# Patient Record
Sex: Female | Born: 1965 | Race: White | Hispanic: No | Marital: Married | Smoking: Never smoker
Health system: Southern US, Community
[De-identification: ages and names within clinical notes are randomized; demographics above are authoritative.]

## PROBLEM LIST (undated history)

## (undated) DIAGNOSIS — F32A Depression, unspecified: Secondary | ICD-10-CM

## (undated) DIAGNOSIS — F329 Major depressive disorder, single episode, unspecified: Secondary | ICD-10-CM

## (undated) DIAGNOSIS — E785 Hyperlipidemia, unspecified: Secondary | ICD-10-CM

## (undated) HISTORY — DX: Hyperlipidemia, unspecified: E78.5

## (undated) HISTORY — DX: Depression, unspecified: F32.A

---
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
## 2019-02-13 ENCOUNTER — Other Ambulatory Visit: Payer: Self-pay

## 2019-02-13 ENCOUNTER — Telehealth: Payer: Self-pay

## 2019-02-13 NOTE — Telephone Encounter (Signed)
Pt. Requesting to establish with a PCP at [facility]. Warm transfer to [place] in the practice.

## 2019-02-17 ENCOUNTER — Encounter: Payer: Self-pay | Admitting: Family Medicine

## 2019-02-17 ENCOUNTER — Other Ambulatory Visit: Payer: Self-pay

## 2019-02-17 ENCOUNTER — Ambulatory Visit (INDEPENDENT_AMBULATORY_CARE_PROVIDER_SITE_OTHER): Admitting: Family Medicine

## 2019-02-17 VITALS — BP 128/82 | HR 74 | Temp 98.0°F | Ht 67.0 in | Wt 198.0 lb

## 2019-02-17 DIAGNOSIS — F418 Other specified anxiety disorders: Secondary | ICD-10-CM | POA: Diagnosis not present

## 2019-02-17 DIAGNOSIS — E785 Hyperlipidemia, unspecified: Secondary | ICD-10-CM | POA: Diagnosis not present

## 2019-02-17 MED ORDER — PRAVASTATIN SODIUM 20 MG PO TABS: 20.0000 mg | ORAL_TABLET | Freq: Every day | ORAL | 3 refills | Status: DC

## 2019-02-17 MED ORDER — HYDROXYZINE HCL 25 MG PO TABS: 25.0000 mg | ORAL_TABLET | Freq: Three times a day (TID) | ORAL | 1 refills | Status: DC | PRN

## 2019-02-17 MED ORDER — VENLAFAXINE HCL ER 37.5 MG PO CP24: 37.5000 mg | ORAL_CAPSULE | Freq: Every day | ORAL | 2 refills | Status: DC

## 2019-02-17 NOTE — Patient Instructions (Signed)
Please consider counseling. Contact [phone] to schedule an appointment or inquire about cost/insurance coverage.  Aim to do some physical exertion for 150 minutes per week. This is typically divided into 5 days per week, 30 minutes per day. The activity should be enough to get your heart rate up. Anything is better than nothing if you have time constraints.  Keep the diet clean.   Coping skills Choose 5 that work for you:  Take a deep breath  Count to 20  Read a book  Do a puzzle  Meditate  Bake  Sing  Knit  Garden  Pray  Go outside  Call a friend  Listen to music  Take a walk  Color  Send a note  Take a bath  Watch a movie  Be alone in a quiet place  Pet an animal  Visit a friend  Journal  Exercise  Stretch   Let us know if you need anything.

## 2019-02-17 NOTE — Progress Notes (Signed)
Chief Complaint  Patient presents with  . New Patient (Initial Visit)       New Patient Visit SUBJECTIVE: HPI: Jenny Roberson is an 53 y.o.female who is being seen for establishing care.  The patient was previously seen in [place].  Hyperlipidemia Patient presents for dyslipidemia follow up. Currently being treated with pravastatin 20 mg daily and compliance with treatment thus far has been good. She denies myalgias. She is adhering to a healthy diet. Exercise: some walking The patient is not known to have coexisting coronary artery disease.  The past 2 months since moving from [place] to [place] amounts of depression, anxiety, and riding in the car are going over/under bridges, over passes, or the expressway causes a panic.  She has been trying to set up with counseling, unsuccessfully.  She has a family history of depression/anxiety.  She used to be on Zoloft and Prozac.  It helped, tolerated well, though they were not especially efficacious.  Denies any thoughts of harming herself or others.  She is not self-medicating.  Allergies  Allergen Reactions  . Amoxicillin Other (See Comments)    Palms of hands and bottom of feet turned red/skin peeled  . Codeine Nausea And Vomiting   Past Medical History:  Diagnosis Date  . Depression   . Hyperlipidemia    Past Surgical History:  Procedure Laterality Date  . ABDOMINAL HYSTERECTOMY  2013  . OTHER SURGICAL HISTORY  2014   lipoma removal     Family History  Problem Relation Age of Onset  . Cancer Mother   . Heart disease Mother    Allergies  Allergen Reactions  . Amoxicillin Other (See Comments)    Palms of hands and bottom of feet turned red/skin peeled  . Codeine Nausea And Vomiting    Current Outpatient Medications:  .  pravastatin (PRAVACHOL) 20 MG tablet, Take 1 tablet (20 mg total) by mouth daily., Disp: 90 tablet, Rfl: 3 .  hydrOXYzine (ATARAX/VISTARIL) 25 MG tablet, Take 1-3 tablets (25-75 mg total) by mouth 3 (three)  times daily as needed for anxiety., Disp: 90 tablet, Rfl: 1 .  venlafaxine XR (EFFEXOR-XR) 37.5 MG 24 hr capsule, Take 1 capsule (37.5 mg total) by mouth daily with breakfast., Disp: 30 capsule, Rfl: 2  ROS Cardiovascular: Denies chest pain  Respiratory: Denies dyspnea   OBJECTIVE: BP 128/82 (BP Location: Left Arm, Patient Position: Sitting, Cuff Size: Large)   Pulse 74   Temp 98 F (36.7 C) (Oral)   Ht 5\' 7"  (1.702 m)   Wt 198 lb (89.8 kg)   SpO2 98%   BMI 31.01 kg/m   Constitutional: -  VS reviewed -  Well developed, well nourished, appears stated age -  No apparent distress  Psychiatric: -  Oriented to person, place, and time -  Memory intact -  Very tearful during exam -  Fluent conversation, good eye contact -  Judgment and insight age appropriate  Eye: -  Conjunctivae clear, no discharge -  Pupils symmetric, round, reactive to light  ENMT: -  MMM    Pharynx moist, no exudate, no erythema  Neck: -  No gross swelling, no palpable masses -  Thyroid midline, not enlarged, mobile, no palpable masses  Cardiovascular: -  RRR -  No bruits -  No LE edema  Respiratory: -  Normal respiratory effort, no accessory muscle use, no retraction -  Breath sounds equal, no wheezes, no ronchi, no crackles  Neurological:  -  CN II - XII grossly  intact -  DTR's equal and symmetric -  Sensation grossly intact to light touch, equal bilaterally  Skin: -  No significant lesion on inspection -  Warm and dry to palpation   ASSESSMENT/PLAN: Anxiety with depression - Plan: venlafaxine XR (EFFEXOR-XR) 37.5 MG 24 hr capsule, hydrOXYzine (ATARAX/VISTARIL) 25 MG tablet, will see if we can help set up w counseling.  Hyperlipidemia, unspecified hyperlipidemia type - Plan: pravastatin (PRAVACHOL) 20 MG tablet, counseled on diet and exercise.   Patient instructed to sign release of records form from her previous PCP. Patient should return in 6 weeks. The patient voiced understanding and agreement  to the plan.   Jilda Rocheicholas Paul [place], DO 02/17/19  12:10 PM

## 2019-03-08 ENCOUNTER — Ambulatory Visit (INDEPENDENT_AMBULATORY_CARE_PROVIDER_SITE_OTHER): Admitting: Psychology

## 2019-03-08 DIAGNOSIS — F4323 Adjustment disorder with mixed anxiety and depressed mood: Secondary | ICD-10-CM

## 2019-03-23 ENCOUNTER — Ambulatory Visit (INDEPENDENT_AMBULATORY_CARE_PROVIDER_SITE_OTHER): Admitting: Psychology

## 2019-03-23 DIAGNOSIS — F4323 Adjustment disorder with mixed anxiety and depressed mood: Secondary | ICD-10-CM | POA: Diagnosis not present

## 2019-03-24 ENCOUNTER — Other Ambulatory Visit (HOSPITAL_COMMUNITY)
Admission: RE | Admit: 2019-03-24 | Discharge: 2019-03-24 | Disposition: A | Discharge status: Home / Self Care | Source: Ambulatory Visit | Attending: Family Medicine | Admitting: Family Medicine

## 2019-03-24 ENCOUNTER — Encounter: Payer: Self-pay | Admitting: Family Medicine

## 2019-03-24 ENCOUNTER — Other Ambulatory Visit: Payer: Self-pay

## 2019-03-24 ENCOUNTER — Ambulatory Visit (INDEPENDENT_AMBULATORY_CARE_PROVIDER_SITE_OTHER): Admitting: Family Medicine

## 2019-03-24 VITALS — BP 108/76 | HR 81 | Temp 96.5°F | Ht 67.0 in | Wt 199.0 lb

## 2019-03-24 DIAGNOSIS — N760 Acute vaginitis: Secondary | ICD-10-CM | POA: Insufficient documentation

## 2019-03-24 DIAGNOSIS — B9689 Other specified bacterial agents as the cause of diseases classified elsewhere: Secondary | ICD-10-CM | POA: Diagnosis present

## 2019-03-24 DIAGNOSIS — F418 Other specified anxiety disorders: Secondary | ICD-10-CM

## 2019-03-24 LAB — POCT URINALYSIS DIPSTICK
Bilirubin, UA: NEGATIVE
Blood, UA: NEGATIVE
Glucose, UA: NEGATIVE
Ketones, UA: NEGATIVE
Leukocytes, UA: NEGATIVE
Nitrite, UA: NEGATIVE
Protein, UA: POSITIVE — AB
Spec Grav, UA: 1.025 (ref 1.010–1.025)
Urobilinogen, UA: NEGATIVE E.U./dL — AB
pH, UA: 6 (ref 5.0–8.0)

## 2019-03-24 MED ORDER — VENLAFAXINE HCL ER 75 MG PO CP24: 75.0000 mg | ORAL_CAPSULE | Freq: Every day | ORAL | 2 refills | Status: DC

## 2019-03-24 MED ORDER — METRONIDAZOLE 500 MG PO TABS: 500.0000 mg | ORAL_TABLET | Freq: Two times a day (BID) | ORAL | 0 refills | Status: AC

## 2019-03-24 NOTE — Progress Notes (Signed)
Chief Complaint  Patient presents with  . Vaginal Discharge    odor    Jenny Roberson is a 53 y.o. female here for vaginal discharge.  Duration: 2 weeks Description of discharge: white Odor: Yes; fishy New sexual partner: Yes Urinary complaints: No Denies fevers, bleeding, pregnancy abdominal pain.  Fu for mood also. Reports doing around 30% better on 37.5 mg/d Effexor. No AE's. Has taken hydroxyzine 2-3 times, worked well when she did take it. She is following w a Social worker.   ROS:  GU: +discharge, denies pain with urination Psych: Improved mood  Past Medical History:  Diagnosis Date  . Depression   . Hyperlipidemia    Family History  Problem Relation Age of Onset  . Cancer Mother   . Heart disease Mother     BP 108/76 (BP Location: Left Arm, Patient Position: Sitting, Cuff Size: Normal)   Pulse 81   Temp (!) 96.5 F (35.8 C) (Temporal)   Ht 5\' 7"  (1.702 m)   Wt 199 lb (90.3 kg)   SpO2 97%   BMI 31.17 kg/m  Gen: Awake, alert, appears stated age Heart: RRR, no murmurs Lungs: CTAB, no accessory muscle use Abd: BS+, soft, NT, ND, no masses or organomegaly GU: Deferred Psych: Age appropriate judgment and insight, nml mood and affect; did become tearful during the exam.   Bacterial vaginosis - Plan: metroNIDAZOLE (FLAGYL) 500 MG tablet, POCT Urinalysis Dipstick  Anxiety with depression - Plan: venlafaxine XR (EFFEXOR XR) 75 MG 24 hr capsule; increase from 37.5 mg/d to 75 mg/d. Cont hydroxyzine. Cont w counseling.   Orders as above. F/u in 1 mo to reck mood. Pt voiced understanding and agreement to the plan.  [place], DO 03/24/19 3:41 PM

## 2019-03-24 NOTE — Patient Instructions (Addendum)
Do not drink alcohol on this medication.  Take 2 caps of your Effexor until you run out, but a new dose has been called in.  Let us know if you need anything.

## 2019-03-24 NOTE — Addendum Note (Signed)
Addended by: Sharon Seller B on: 03/24/2019 03:53 PM   Modules accepted: Orders

## 2019-03-27 LAB — URINE CYTOLOGY ANCILLARY ONLY

## 2019-03-31 ENCOUNTER — Encounter (HOSPITAL_BASED_OUTPATIENT_CLINIC_OR_DEPARTMENT_OTHER)

## 2019-03-31 ENCOUNTER — Ambulatory Visit: Admitting: Family Medicine

## 2019-04-04 ENCOUNTER — Other Ambulatory Visit (HOSPITAL_BASED_OUTPATIENT_CLINIC_OR_DEPARTMENT_OTHER): Payer: Self-pay | Admitting: Family Medicine

## 2019-04-04 ENCOUNTER — Ambulatory Visit (HOSPITAL_BASED_OUTPATIENT_CLINIC_OR_DEPARTMENT_OTHER)
Admission: RE | Admit: 2019-04-04 | Discharge: 2019-04-04 | Disposition: A | Discharge status: Home / Self Care | Source: Ambulatory Visit

## 2019-04-04 ENCOUNTER — Other Ambulatory Visit: Payer: Self-pay

## 2019-04-04 DIAGNOSIS — Z1231 Encounter for screening mammogram for malignant neoplasm of breast: Secondary | ICD-10-CM

## 2019-04-05 ENCOUNTER — Ambulatory Visit (INDEPENDENT_AMBULATORY_CARE_PROVIDER_SITE_OTHER): Admitting: Psychology

## 2019-04-05 DIAGNOSIS — F4323 Adjustment disorder with mixed anxiety and depressed mood: Secondary | ICD-10-CM

## 2019-04-19 ENCOUNTER — Ambulatory Visit (INDEPENDENT_AMBULATORY_CARE_PROVIDER_SITE_OTHER): Admitting: Psychology

## 2019-04-19 DIAGNOSIS — F4323 Adjustment disorder with mixed anxiety and depressed mood: Secondary | ICD-10-CM

## 2019-04-24 ENCOUNTER — Encounter: Payer: Self-pay | Admitting: Family Medicine

## 2019-04-24 ENCOUNTER — Ambulatory Visit (INDEPENDENT_AMBULATORY_CARE_PROVIDER_SITE_OTHER): Admitting: Family Medicine

## 2019-04-24 ENCOUNTER — Other Ambulatory Visit: Payer: Self-pay

## 2019-04-24 VITALS — BP 118/72 | HR 106 | Temp 98.3°F | Ht 67.0 in | Wt 199.2 lb

## 2019-04-24 DIAGNOSIS — Z23 Encounter for immunization: Secondary | ICD-10-CM

## 2019-04-24 DIAGNOSIS — F418 Other specified anxiety disorders: Secondary | ICD-10-CM | POA: Diagnosis not present

## 2019-04-24 NOTE — Patient Instructions (Signed)
Find out for sure when your last tetanus shot was.  Let us know when you need refills.  Let us know if you need anything.

## 2019-04-24 NOTE — Progress Notes (Signed)
Chief Complaint  Patient presents with  . Follow-up    Subjective Jenny Roberson presents for f/u anxiety/depression.  She is currently being treated with Effexor 75 mg/d XR.  Reports good improvement since treatment. No thoughts of harming self or others. No self-medication with alcohol, prescription drugs or illicit drugs. Pt is following with a counselor/psychologist.  ROS Psych: No homicidal or suicidal thoughts  Past Medical History:  Diagnosis Date  . Depression   . Hyperlipidemia    Exam BP 118/72 (BP Location: Left Arm, Patient Position: Sitting, Cuff Size: Normal)   Pulse (!) 106   Temp 98.3 F (36.8 C) (Temporal)   Ht 5\' 7"  (1.702 m)   Wt 199 lb 4 oz (90.4 kg)   SpO2 97%   BMI 31.21 kg/m  General:  well developed, well nourished, in no apparent distress Lungs:  clear to auscultation, breath sounds equal bilaterally, no respiratory distress Cardio:  regular rate and rhythm without murmurs, heart sounds without clicks or rubs Psych: well oriented with normal range of affect and age-appropriate judgement/insight, alert and oriented x4.  Assessment and Plan  Anxiety with depression  Need for influenza vaccination - Plan: Flu Vaccine QUAD 6+ mos PF IM (Fluarix Quad PF)  Cont Effexor. She looks happy. Does not wish to change.  F/u in 6 mo for CPE or pr. The patient voiced understanding and agreement to the plan.  [place], DO 04/24/19 1:39 PM

## 2019-04-25 ENCOUNTER — Encounter (HOSPITAL_BASED_OUTPATIENT_CLINIC_OR_DEPARTMENT_OTHER): Payer: Self-pay | Admitting: Radiology

## 2019-04-25 ENCOUNTER — Ambulatory Visit (HOSPITAL_BASED_OUTPATIENT_CLINIC_OR_DEPARTMENT_OTHER)
Admission: RE | Admit: 2019-04-25 | Discharge: 2019-04-25 | Disposition: A | Discharge status: Home / Self Care | Source: Ambulatory Visit | Attending: Family Medicine | Admitting: Family Medicine

## 2019-04-25 DIAGNOSIS — Z1231 Encounter for screening mammogram for malignant neoplasm of breast: Secondary | ICD-10-CM | POA: Insufficient documentation

## 2019-05-09 ENCOUNTER — Other Ambulatory Visit: Payer: Self-pay | Admitting: Family Medicine

## 2019-05-09 DIAGNOSIS — F418 Other specified anxiety disorders: Secondary | ICD-10-CM

## 2019-05-09 MED ORDER — VENLAFAXINE HCL ER 75 MG PO CP24: 75.0000 mg | ORAL_CAPSULE | Freq: Every day | ORAL | 2 refills | Status: DC

## 2019-05-10 ENCOUNTER — Ambulatory Visit (INDEPENDENT_AMBULATORY_CARE_PROVIDER_SITE_OTHER): Admitting: Psychology

## 2019-05-10 DIAGNOSIS — F4323 Adjustment disorder with mixed anxiety and depressed mood: Secondary | ICD-10-CM | POA: Diagnosis not present

## 2019-05-31 ENCOUNTER — Ambulatory Visit: Admitting: Psychology

## 2019-08-04 ENCOUNTER — Other Ambulatory Visit: Payer: Self-pay | Admitting: Family Medicine

## 2019-08-04 DIAGNOSIS — F418 Other specified anxiety disorders: Secondary | ICD-10-CM

## 2019-08-04 MED ORDER — VENLAFAXINE HCL ER 75 MG PO CP24: 75.0000 mg | ORAL_CAPSULE | Freq: Every day | ORAL | 2 refills | Status: DC

## 2019-09-12 ENCOUNTER — Ambulatory Visit (HOSPITAL_BASED_OUTPATIENT_CLINIC_OR_DEPARTMENT_OTHER)
Admission: RE | Admit: 2019-09-12 | Discharge: 2019-09-12 | Disposition: A | Discharge status: Home / Self Care | Source: Ambulatory Visit | Attending: Medical | Admitting: Medical

## 2019-09-12 ENCOUNTER — Other Ambulatory Visit: Payer: Self-pay

## 2019-09-12 ENCOUNTER — Ambulatory Visit (INDEPENDENT_AMBULATORY_CARE_PROVIDER_SITE_OTHER): Admitting: Medical

## 2019-09-12 VITALS — BP 104/60 | HR 98 | Temp 97.4°F | Resp 12 | Ht 67.0 in | Wt 202.4 lb

## 2019-09-12 DIAGNOSIS — M25572 Pain in left ankle and joints of left foot: Secondary | ICD-10-CM | POA: Diagnosis not present

## 2019-09-12 NOTE — Patient Instructions (Addendum)
You had probable  mild strain of medial ankle connective tissue. Getting better now. Would recommend ace wrap for additional 3-4 days, low dose ibuprofen and rest.  After discussion placed xray to ankle can get done today.  Follow up as needed

## 2019-09-12 NOTE — Progress Notes (Signed)
Subjective:    Patient ID: Jenny Roberson, female    DOB: 11-20-1965, 54 y.o.   MRN: [id number]  HPI  Pt in with left ankle and swelling for about one week. She states last week she was walking and noted some pain. Pt states [facility] but then [facility].   Last week had the pain. Today pain is minimal now with residual swelling. No recent exercise or trauma.   Pt tried some ace wrap for 3 days and some advil.    Review of Systems  Constitutional: Negative for chills, fatigue and fever.  Respiratory: Negative for chest tightness, shortness of breath and wheezing.   Cardiovascular: Negative for chest pain and palpitations.  Gastrointestinal: Negative for abdominal pain, blood in stool, diarrhea, rectal pain and vomiting.  Musculoskeletal: Negative for back pain.  Skin: Negative for rash.  Hematological: Negative for adenopathy. Does not bruise/bleed easily.  Psychiatric/Behavioral: Negative for behavioral problems and confusion.   Past Medical History:  Diagnosis Date  . Depression   . Hyperlipidemia      Social History   Socioeconomic History  . Marital status: Married    Spouse name: Not on file  . Number of children: Not on file  . Years of education: Not on file  . Highest education level: Not on file  Occupational History  . Not on file  Tobacco Use  . Smoking status: Never Smoker  . Smokeless tobacco: Never Used  Substance and Sexual Activity  . Alcohol use: Not on file    Comment: occasional  . Drug use: Never  . Sexual activity: Not on file  Other Topics Concern  . Not on file  Social History Narrative  . Not on file   Social Determinants of Health   Financial Resource Strain:   . Difficulty of Paying Living Expenses: Not on file  Food Insecurity:   . Worried About Charity fundraiser in the Last Year: Not on file  . Ran Out of Food in the Last Year: Not on file  Transportation Needs:   . Lack of Transportation (Medical): Not  on file  . Lack of Transportation (Non-Medical): Not on file  Physical Activity:   . Days of Exercise per Week: Not on file  . Minutes of Exercise per Session: Not on file  Stress:   . Feeling of Stress : Not on file  Social Connections:   . Frequency of Communication with Friends and Family: Not on file  . Frequency of Social Gatherings with Friends and Family: Not on file  . Attends Religious Services: Not on file  . Active Member of Clubs or Organizations: Not on file  . Attends Archivist Meetings: Not on file  . Marital Status: Not on file  Intimate Partner Violence:   . Fear of Current or Ex-Partner: Not on file  . Emotionally Abused: Not on file  . Physically Abused: Not on file  . Sexually Abused: Not on file    Past Surgical History:  Procedure Laterality Date  . ABDOMINAL HYSTERECTOMY  2013  . OTHER SURGICAL HISTORY  2014   lipoma removal    Family History  Problem Relation Age of Onset  . Cancer Mother   . Heart disease Mother     Allergies  Allergen Reactions  . Amoxicillin Other (See Comments)    Palms of hands and bottom of feet turned red/skin peeled  . Codeine Nausea And Vomiting    Current  Outpatient Medications on File Prior to Visit  Medication Sig Dispense Refill  . hydrOXYzine (ATARAX/VISTARIL) 25 MG tablet Take 1-3 tablets (25-75 mg total) by mouth 3 (three) times daily as needed for anxiety. 90 tablet 1  . pravastatin (PRAVACHOL) 20 MG tablet Take 1 tablet (20 mg total) by mouth daily. 90 tablet 3  . venlafaxine XR (EFFEXOR XR) 75 MG 24 hr capsule Take 1 capsule (75 mg total) by mouth daily with breakfast. 30 capsule 2   No current facility-administered medications on file prior to visit.    BP 104/60 (BP Location: Left Arm, Cuff Size: Large)   Pulse 98   Temp (!) 97.4 F (36.3 C) (Temporal)   Resp 12   Ht 5\' 7"  (1.702 m)   Wt 202 lb 6.4 oz (91.8 kg)   SpO2 100%   BMI 31.70 kg/m       Objective:   Physical  Exam  General- No acute distress. Pleasant patient. Lungs- Clear, even and unlabored. Heart- regular rate and rhythm. Neurologic- CNII- XII grossly intact.  Left ankle- faint swelling to medial aspect. No warmth. Faint very minimal tender to palpation.       Assessment & Plan:  You probable had mild strain of medial ankle connective tissue. Getting better now. Would recommend ace wrap for additional 3-4 days, low dose ibuprofen and rest.  After discussion placed xray to ankle can get done today.  Follow up as needed  20 minutes spent with pt today.

## 2019-09-13 ENCOUNTER — Telehealth: Payer: Self-pay | Admitting: Family Medicine

## 2019-09-13 NOTE — Telephone Encounter (Signed)
Pt is returning a call from Xray results. I told her she would get a cal on Friday due to [place] bing closed on Friday

## 2019-09-14 NOTE — Telephone Encounter (Signed)
No acute abnormality of ankle on xray. Not sure why xray report did not come to me. But found under image?? Notify pt.

## 2019-09-15 NOTE — Telephone Encounter (Signed)
Patient given results

## 2019-10-06 ENCOUNTER — Other Ambulatory Visit: Payer: Self-pay

## 2019-10-09 ENCOUNTER — Other Ambulatory Visit: Payer: Self-pay

## 2019-10-09 ENCOUNTER — Ambulatory Visit (INDEPENDENT_AMBULATORY_CARE_PROVIDER_SITE_OTHER): Admitting: Family Medicine

## 2019-10-09 ENCOUNTER — Encounter: Payer: Self-pay | Admitting: Family Medicine

## 2019-10-09 VITALS — BP 108/62 | HR 97 | Temp 95.8°F | Ht 67.0 in | Wt 201.2 lb

## 2019-10-09 DIAGNOSIS — Z0000 Encounter for general adult medical examination without abnormal findings: Secondary | ICD-10-CM | POA: Diagnosis not present

## 2019-10-09 DIAGNOSIS — Z114 Encounter for screening for human immunodeficiency virus [HIV]: Secondary | ICD-10-CM | POA: Diagnosis not present

## 2019-10-09 LAB — CBC
HCT: 29.4 % — ABNORMAL LOW (ref 36.0–46.0)
Hemoglobin: 9.8 g/dL — ABNORMAL LOW (ref 12.0–15.0)
MCHC: 33.4 g/dL (ref 30.0–36.0)
MCV: 86.8 fl (ref 78.0–100.0)
Platelets: 229 10*3/uL (ref 150.0–400.0)
RBC: 3.38 Mil/uL — ABNORMAL LOW (ref 3.87–5.11)
RDW: 14 % (ref 11.5–15.5)
WBC: 5.2 10*3/uL (ref 4.0–10.5)

## 2019-10-09 LAB — LIPID PANEL
Cholesterol: 169 mg/dL (ref 0–200)
HDL: 45.7 mg/dL (ref >39.00)
NonHDL: 123.06
Total CHOL/HDL Ratio: 4
Triglycerides: 203 mg/dL — ABNORMAL HIGH (ref 0.0–149.0)
VLDL: 40.6 mg/dL — ABNORMAL HIGH (ref 0.0–40.0)

## 2019-10-09 LAB — COMPREHENSIVE METABOLIC PANEL
ALT: 23 U/L (ref 0–35)
AST: 19 U/L (ref 0–37)
Albumin: 4 g/dL (ref 3.5–5.2)
Alkaline Phosphatase: 124 U/L — ABNORMAL HIGH (ref 39–117)
BUN: 15 mg/dL (ref 6–23)
CO2: 24 mEq/L (ref 19–32)
Calcium: 9.2 mg/dL (ref 8.4–10.5)
Chloride: 103 mEq/L (ref 96–112)
Creatinine, Ser: 0.76 mg/dL (ref 0.40–1.20)
GFR: 79.26 mL/min (ref >60.00)
Glucose, Bld: 147 mg/dL — ABNORMAL HIGH (ref 70–99)
Potassium: 3.5 mEq/L (ref 3.5–5.1)
Sodium: 137 mEq/L (ref 135–145)
Total Bilirubin: 0.3 mg/dL (ref 0.2–1.2)
Total Protein: 7.1 g/dL (ref 6.0–8.3)

## 2019-10-09 LAB — LDL CHOLESTEROL, DIRECT: Direct LDL: 91 mg/dL

## 2019-10-09 NOTE — Progress Notes (Signed)
Chief Complaint  Patient presents with  . Annual Exam     Well Woman Jenny Roberson is here for a complete physical.   Her last physical was >1 year ago.  Current diet: in general, a "healthy" diet. Current exercise: . Weight is stable and she denies daytime fatigue. No LMP recorded. Patient has had a hysterectomy. Seatbelt? Yes  Health Maintenance Mammogram-  Yes Colon cancer screening-Yes Shingrix- No Tetanus- Yes HIV screening- No  Past Medical History:  Diagnosis Date  . Depression   . Hyperlipidemia      Past Surgical History:  Procedure Laterality Date  . ABDOMINAL HYSTERECTOMY  2013  . OTHER SURGICAL HISTORY  2014   lipoma removal    Medications  Current Outpatient Medications on File Prior to Visit  Medication Sig Dispense Refill  . hydrOXYzine (ATARAX/VISTARIL) 25 MG tablet Take 1-3 tablets (25-75 mg total) by mouth 3 (three) times daily as needed for anxiety. 90 tablet 1  . pravastatin (PRAVACHOL) 20 MG tablet Take 1 tablet (20 mg total) by mouth daily. 90 tablet 3  . venlafaxine XR (EFFEXOR XR) 75 MG 24 hr capsule Take 1 capsule (75 mg total) by mouth daily with breakfast. 30 capsule 2    Allergies Allergies  Allergen Reactions  . Amoxicillin Other (See Comments)    Palms of hands and bottom of feet turned red/skin peeled  . Codeine Nausea And Vomiting    Review of Systems: Constitutional:  no unexpected weight changes Eye:  no recent significant change in vision Ear/Nose/Mouth/Throat:  Ears:  no recent change in hearing Nose/Mouth/Throat:  no complaints of nasal congestion, no sore throat Cardiovascular: no chest pain Respiratory:  no shortness of breath Gastrointestinal:  no abdominal pain, no change in bowel habits GU:  Female: negative for dysuria or pelvic pain Musculoskeletal/Extremities:  no pain of the joints Integumentary (Skin/Breast):  no abnormal skin lesions reported Neurologic:  no headaches Endocrine:  denies  fatigue Hematologic/Lymphatic:  No areas of easy bleeding  Exam BP 108/62 (BP Location: Left Arm, Patient Position: Sitting, Cuff Size: Normal)   Pulse 97   Temp (!) 95.8 F (35.4 C) (Temporal)   Ht 5\' 7"  (1.702 m)   Wt 201 lb 4 oz (91.3 kg)   SpO2 98%   BMI 31.52 kg/m  General:  well developed, well nourished, in no apparent distress Skin:  no significant moles, warts, or growths Head:  no masses, lesions, or tenderness Eyes:  pupils equal and round, sclera anicteric without injection Ears:  canals without lesions, TMs shiny without retraction, no obvious effusion, no erythema Nose:  nares patent, septum midline, mucosa normal, and no drainage or sinus tenderness Throat/Pharynx:  lips and gingiva without lesion; tongue and uvula midline; non-inflamed pharynx; no exudates or postnasal drainage Neck: neck supple without adenopathy, thyromegaly, or masses Lungs:  clear to auscultation, breath sounds equal bilaterally, no respiratory distress Cardio:  regular rate and rhythm, no LE edema Abdomen:  abdomen soft, nontender; bowel sounds normal; no masses or organomegaly Genital: Defer to GYN Musculoskeletal:  symmetrical muscle groups noted without atrophy or deformity Extremities:  no clubbing, cyanosis, or edema, no deformities, no skin discoloration Neuro:  gait normal; deep tendon reflexes normal and symmetric Psych: well oriented with normal range of affect and appropriate judgment/insight  Assessment and Plan  Well adult exam - Plan: CBC, Comprehensive metabolic panel, Lipid panel  Screening for HIV (human immunodeficiency virus) - Plan: HIV Antibody (routine testing w rflx)   Well 54 y.o. female.  Counseled on diet and exercise. Other orders as above. GYN info. I think she is going through menopause based on s/s's. Soy, black cohosh suggested. Declined med changes. She is on SNRI.  Follow up in 6 mo. The patient voiced understanding and agreement to the plan.  [place], DO 10/09/19 9:39 AM

## 2019-10-09 NOTE — Patient Instructions (Addendum)
Give [place] 2-3 business days to get the results of your labs back.   Keep the diet clean and stay active.  The new Shingrix vaccine (for shingles) is a 2 shot series. It can make people feel low energy, achy and almost like they have the flu for 48 hours after injection. Please plan accordingly when deciding on when to get this shot. Call our office for a nurse visit appointment to get this. The second shot of the series is less severe regarding the side effects, but it still lasts 48 hours. I would wait at least 2 weeks after your second covid vaccination.   Call [facility] at [facility] at [phone] for an appointment.  They are located at [address], [address], [place], [place], [address] (right across the hall from our office).  Consider black cohosh or soy to help with menopausal symptoms.   Let us know if you need anything.

## 2019-10-10 ENCOUNTER — Other Ambulatory Visit: Payer: Self-pay | Admitting: Family Medicine

## 2019-10-10 ENCOUNTER — Other Ambulatory Visit (INDEPENDENT_AMBULATORY_CARE_PROVIDER_SITE_OTHER)

## 2019-10-10 ENCOUNTER — Encounter: Payer: Self-pay | Admitting: Family Medicine

## 2019-10-10 DIAGNOSIS — R739 Hyperglycemia, unspecified: Secondary | ICD-10-CM

## 2019-10-10 DIAGNOSIS — D729 Disorder of white blood cells, unspecified: Secondary | ICD-10-CM

## 2019-10-10 DIAGNOSIS — D72818 Other decreased white blood cell count: Secondary | ICD-10-CM

## 2019-10-10 DIAGNOSIS — R748 Abnormal levels of other serum enzymes: Secondary | ICD-10-CM

## 2019-10-10 DIAGNOSIS — E781 Pure hyperglyceridemia: Secondary | ICD-10-CM

## 2019-10-10 LAB — IBC + FERRITIN
Ferritin: 4.9 ng/mL — ABNORMAL LOW (ref 10.0–291.0)
Iron: 42 ug/dL (ref 42–145)
Saturation Ratios: 9.1 % — ABNORMAL LOW (ref 20.0–50.0)
Transferrin: 331 mg/dL (ref 212.0–360.0)

## 2019-10-10 LAB — HIV ANTIBODY (ROUTINE TESTING W REFLEX): HIV 1&2 Ab, 4th Generation: NONREACTIVE

## 2019-10-10 LAB — HEMOGLOBIN A1C: Hgb A1c MFr Bld: 5.3 % (ref 4.6–6.5)

## 2019-10-20 ENCOUNTER — Other Ambulatory Visit: Payer: Self-pay

## 2019-10-23 ENCOUNTER — Other Ambulatory Visit (INDEPENDENT_AMBULATORY_CARE_PROVIDER_SITE_OTHER)

## 2019-10-23 ENCOUNTER — Other Ambulatory Visit: Payer: Self-pay

## 2019-10-23 DIAGNOSIS — E781 Pure hyperglyceridemia: Secondary | ICD-10-CM | POA: Diagnosis not present

## 2019-10-23 DIAGNOSIS — R748 Abnormal levels of other serum enzymes: Secondary | ICD-10-CM | POA: Diagnosis not present

## 2019-10-23 LAB — LIPID PANEL
Cholesterol: 149 mg/dL (ref 0–200)
HDL: 47.6 mg/dL (ref >39.00)
LDL Cholesterol: 68 mg/dL (ref 0–99)
NonHDL: 101.87
Total CHOL/HDL Ratio: 3
Triglycerides: 170 mg/dL — ABNORMAL HIGH (ref 0.0–149.0)
VLDL: 34 mg/dL (ref 0.0–40.0)

## 2019-10-23 LAB — GAMMA GT: GGT: 33 U/L (ref 7–51)

## 2019-10-23 LAB — HEPATIC FUNCTION PANEL
ALT: 18 U/L (ref 0–35)
AST: 16 U/L (ref 0–37)
Albumin: 4.2 g/dL (ref 3.5–5.2)
Alkaline Phosphatase: 117 U/L (ref 39–117)
Bilirubin, Direct: 0.1 mg/dL (ref 0.0–0.3)
Total Bilirubin: 0.4 mg/dL (ref 0.2–1.2)
Total Protein: 6.8 g/dL (ref 6.0–8.3)

## 2019-10-24 ENCOUNTER — Encounter: Admitting: Family Medicine

## 2019-10-26 LAB — ALKALINE PHOSPHATASE ISOENZYMES
Alkaline phosphatase (APISO): 117 U/L (ref 37–153)
Bone Isoenzymes: 50 % (ref 28–66)
Intestinal Isoenzymes: 0 % — ABNORMAL LOW (ref 1–24)
Liver Isoenzymes: 50 % (ref 25–69)

## 2019-11-20 ENCOUNTER — Other Ambulatory Visit: Payer: Self-pay | Admitting: Family Medicine

## 2019-11-20 DIAGNOSIS — F418 Other specified anxiety disorders: Secondary | ICD-10-CM

## 2019-11-20 MED ORDER — VENLAFAXINE HCL ER 75 MG PO CP24: 75.0000 mg | ORAL_CAPSULE | Freq: Every day | ORAL | 2 refills | Status: DC

## 2020-02-22 ENCOUNTER — Other Ambulatory Visit: Payer: Self-pay | Admitting: Family Medicine

## 2020-02-22 DIAGNOSIS — F418 Other specified anxiety disorders: Secondary | ICD-10-CM

## 2020-03-18 ENCOUNTER — Other Ambulatory Visit: Payer: Self-pay | Admitting: Family Medicine

## 2020-03-18 DIAGNOSIS — E785 Hyperlipidemia, unspecified: Secondary | ICD-10-CM

## 2020-04-10 ENCOUNTER — Ambulatory Visit: Admitting: Family Medicine

## 2020-04-18 ENCOUNTER — Other Ambulatory Visit: Payer: Self-pay | Admitting: Family Medicine

## 2020-04-18 DIAGNOSIS — Z1231 Encounter for screening mammogram for malignant neoplasm of breast: Secondary | ICD-10-CM

## 2020-04-23 ENCOUNTER — Ambulatory Visit (INDEPENDENT_AMBULATORY_CARE_PROVIDER_SITE_OTHER): Admitting: Family Medicine

## 2020-04-23 ENCOUNTER — Other Ambulatory Visit: Payer: Self-pay

## 2020-04-23 ENCOUNTER — Encounter: Payer: Self-pay | Admitting: Family Medicine

## 2020-04-23 VITALS — BP 108/68 | HR 75 | Temp 98.2°F | Ht 67.0 in | Wt 202.2 lb

## 2020-04-23 DIAGNOSIS — F418 Other specified anxiety disorders: Secondary | ICD-10-CM | POA: Diagnosis not present

## 2020-04-23 DIAGNOSIS — Z23 Encounter for immunization: Secondary | ICD-10-CM | POA: Diagnosis not present

## 2020-04-23 DIAGNOSIS — R790 Abnormal level of blood mineral: Secondary | ICD-10-CM | POA: Diagnosis not present

## 2020-04-23 DIAGNOSIS — E785 Hyperlipidemia, unspecified: Secondary | ICD-10-CM

## 2020-04-23 MED ORDER — VENLAFAXINE HCL ER 75 MG PO CP24: ORAL_CAPSULE | ORAL | 2 refills | Status: DC

## 2020-04-23 MED ORDER — HYDROXYZINE HCL 25 MG PO TABS: 25.0000 mg | ORAL_TABLET | Freq: Three times a day (TID) | ORAL | 1 refills | Status: DC | PRN

## 2020-04-23 NOTE — Progress Notes (Signed)
Chief Complaint  Patient presents with  . Follow-up    check lab work    Subjective Jenny Roberson presents for f/u anxiety/depression.   Pt is currently being treated with Effexor XR 75 mg/d.  Reports doing well since treatment. No thoughts of harming self or others. No self-medication with alcohol, prescription drugs or illicit drugs. Pt is not following with a counselor/psychologist.  Hyperlipidemia Patient presents for dyslipidemia follow up. Currently being treated with pravastatin 20 mg/d and compliance with treatment thus far has been good. She denies myalgias. She is adhering to a healthy diet. Exercise: cycling The patient is not known to have coexisting coronary artery disease. No CP or SOB.   Past Medical History:  Diagnosis Date  . Depression   . Hyperlipidemia    Allergies as of 04/23/2020      Reactions   Amoxicillin Other (See Comments)   Palms of hands and bottom of feet turned red/skin peeled   Codeine Nausea And Vomiting      Medication List       Accurate as of April 23, 2020  8:12 AM. If you have any questions, ask your nurse or doctor.        hydrOXYzine 25 MG tablet Commonly known as: ATARAX/VISTARIL Take 1-3 tablets (25-75 mg total) by mouth 3 (three) times daily as needed for anxiety.   pravastatin 20 MG tablet Commonly known as: PRAVACHOL TAKE 1 TABLET DAILY   venlafaxine XR 75 MG 24 hr capsule Commonly known as: EFFEXOR-XR TAKE 1 CAPSULE(75 MG) BY MOUTH DAILY WITH BREAKFAST       Exam BP 108/68 (BP Location: Left Arm, Patient Position: Sitting, Cuff Size: Normal)   Pulse 75   Temp 98.2 F (36.8 C) (Oral)   Ht 5\' 7"  (1.702 m)   Wt 202 lb 4 oz (91.7 kg)   SpO2 96%   BMI 31.68 kg/m  General:  well developed, well nourished, in no apparent distress Lungs:  No respiratory distress Psych: well oriented with normal range of affect and age-appropriate judgement/insight, alert and oriented x4.  Assessment and Plan  Anxiety  with depression - Plan: venlafaxine XR (EFFEXOR-XR) 75 MG 24 hr capsule, hydrOXYzine (ATARAX/VISTARIL) 25 MG tablet  Hyperlipidemia, unspecified hyperlipidemia type - Plan: Comprehensive metabolic panel, Lipid panel  Low ferritin - Plan: Iron, Ferritin  Need for influenza vaccination - Plan: Flu Vaccine QUAD 6+ mos PF IM (Fluarix Quad PF)   1. Cont Effexor and prn Hydroxyzine.  2. Ck labs. Cont statin. F/u in 6 mo for CPE. The patient voiced understanding and agreement to the plan.  [place], DO 04/23/20 8:12 AM

## 2020-04-23 NOTE — Patient Instructions (Signed)
Give us 2-3 business days to get the results of your labs back.   Keep the diet clean and stay active.  Let us know if you need anything. 

## 2020-04-24 LAB — COMPREHENSIVE METABOLIC PANEL
AG Ratio: 1.5 (calc) (ref 1.0–2.5)
ALT: 12 U/L (ref 6–29)
AST: 12 U/L (ref 10–35)
Albumin: 4.4 g/dL (ref 3.6–5.1)
Alkaline phosphatase (APISO): 110 U/L (ref 37–153)
BUN: 21 mg/dL (ref 7–25)
CO2: 27 mmol/L (ref 20–32)
Calcium: 9.8 mg/dL (ref 8.6–10.4)
Chloride: 105 mmol/L (ref 98–110)
Creat: 0.74 mg/dL (ref 0.50–1.05)
Globulin: 2.9 g/dL (calc) (ref 1.9–3.7)
Glucose, Bld: 98 mg/dL (ref 65–99)
Potassium: 4.4 mmol/L (ref 3.5–5.3)
Sodium: 140 mmol/L (ref 135–146)
Total Bilirubin: 0.4 mg/dL (ref 0.2–1.2)
Total Protein: 7.3 g/dL (ref 6.1–8.1)

## 2020-04-24 LAB — LIPID PANEL
Cholesterol: 163 mg/dL (ref <200)
HDL: 64 mg/dL (ref >=50)
LDL Cholesterol (Calc): 79 mg/dL (calc)
Non-HDL Cholesterol (Calc): 99 mg/dL (calc) (ref <130)
Total CHOL/HDL Ratio: 2.5 (calc) (ref <5.0)
Triglycerides: 118 mg/dL (ref <150)

## 2020-04-24 LAB — FERRITIN: Ferritin: 24 ng/mL (ref 16–232)

## 2020-04-24 LAB — IRON: Iron: 100 ug/dL (ref 45–160)

## 2020-04-25 ENCOUNTER — Ambulatory Visit (INDEPENDENT_AMBULATORY_CARE_PROVIDER_SITE_OTHER)

## 2020-04-25 ENCOUNTER — Other Ambulatory Visit: Payer: Self-pay

## 2020-04-25 DIAGNOSIS — Z1231 Encounter for screening mammogram for malignant neoplasm of breast: Secondary | ICD-10-CM

## 2020-05-06 ENCOUNTER — Encounter: Payer: Self-pay | Admitting: Obstetrics & Gynecology

## 2020-05-06 ENCOUNTER — Other Ambulatory Visit: Payer: Self-pay

## 2020-05-06 ENCOUNTER — Ambulatory Visit (INDEPENDENT_AMBULATORY_CARE_PROVIDER_SITE_OTHER): Admitting: Obstetrics & Gynecology

## 2020-05-06 VITALS — BP 89/57 | HR 90 | Ht 67.0 in | Wt 202.0 lb

## 2020-05-06 DIAGNOSIS — N951 Menopausal and female climacteric states: Secondary | ICD-10-CM | POA: Diagnosis not present

## 2020-05-06 DIAGNOSIS — R635 Abnormal weight gain: Secondary | ICD-10-CM

## 2020-05-06 DIAGNOSIS — Z01419 Encounter for gynecological examination (general) (routine) without abnormal findings: Secondary | ICD-10-CM

## 2020-05-06 MED ORDER — GABAPENTIN 300 MG PO CAPS: 300.0000 mg | ORAL_CAPSULE | Freq: Every day | ORAL | 2 refills | Status: DC

## 2020-05-06 NOTE — Patient Instructions (Signed)
Apps:  MyFitnessPal  FitOn   Calorie Counting for Weight Loss Calories are units of energy. Your body needs a certain amount of calories from food to keep you going throughout the day. When you eat more calories than your body needs, your body stores the extra calories as fat. When you eat fewer calories than your body needs, your body burns fat to get the energy it needs. Calorie counting means keeping track of how many calories you eat and drink each day. Calorie counting can be helpful if you need to lose weight. If you make sure to eat fewer calories than your body needs, you should lose weight. Ask your health care provider what a healthy weight is for you. For calorie counting to work, you will need to eat the right number of calories in a day in order to lose a healthy amount of weight per week. A dietitian can help you determine how many calories you need in a day and will give you suggestions on how to reach your calorie goal.  A healthy amount of weight to lose per week is usually 1-2 lb (0.5-0.9 kg). This usually means that your daily calorie intake should be reduced by 500-750 calories.  Eating 1,200 - 1,500 calories per day can help most women lose weight.  Eating 1,500 - 1,800 calories per day can help most men lose weight. What is my plan? My goal is to have __________ calories per day. If I have this many calories per day, I should lose around __________ pounds per week. What do I need to know about calorie counting? In order to meet your daily calorie goal, you will need to:  Find out how many calories are in each food you would like to eat. Try to do this before you eat.  Decide how much of the food you plan to eat.  Write down what you ate and how many calories it had. Doing this is called keeping a food log. To successfully lose weight, it is important to balance calorie counting with a healthy lifestyle that includes regular activity. Aim for 150 minutes of moderate  exercise (such as walking) or 75 minutes of vigorous exercise (such as running) each week. Where do I find calorie information?  The number of calories in a food can be found on a Nutrition Facts label. If a food does not have a Nutrition Facts label, try to look up the calories online or ask your dietitian for help. Remember that calories are listed per serving. If you choose to have more than one serving of a food, you will have to multiply the calories per serving by the amount of servings you plan to eat. For example, the label on a package of bread might say that a serving size is 1 slice and that there are 90 calories in a serving. If you eat 1 slice, you will have eaten 90 calories. If you eat 2 slices, you will have eaten 180 calories. How do I keep a food log? Immediately after each meal, record the following information in your food log:  What you ate. Don't forget to include toppings, sauces, and other extras on the food.  How much you ate. This can be measured in cups, ounces, or number of items.  How many calories each food and drink had.  The total number of calories in the meal. Keep your food log near you, such as in a small notebook in your pocket, or use a mobile  app or website. Some programs will calculate calories for you and show you how many calories you have left for the day to meet your goal. What are some calorie counting tips?   Use your calories on foods and drinks that will fill you up and not leave you hungry: ? Some examples of foods that fill you up are nuts and nut butters, vegetables, lean proteins, and high-fiber foods like whole grains. High-fiber foods are foods with more than 5 g fiber per serving. ? Drinks such as sodas, specialty coffee drinks, alcohol, and juices have a lot of calories, yet do not fill you up.  Eat nutritious foods and avoid empty calories. Empty calories are calories you get from foods or beverages that do not have many vitamins or  protein, such as candy, sweets, and soda. It is better to have a nutritious high-calorie food (such as an avocado) than a food with few nutrients (such as a bag of chips).  Know how many calories are in the foods you eat most often. This will help you calculate calorie counts faster.  Pay attention to calories in drinks. Low-calorie drinks include water and unsweetened drinks.  Pay attention to nutrition labels for "low fat" or "fat free" foods. These foods sometimes have the same amount of calories or more calories than the full fat versions. They also often have added sugar, starch, or salt, to make up for flavor that was removed with the fat.  Find a way of tracking calories that works for you. Get creative. Try different apps or programs if writing down calories does not work for you. What are some portion control tips?  Know how many calories are in a serving. This will help you know how many servings of a certain food you can have.  Use a measuring cup to measure serving sizes. You could also try weighing out portions on a kitchen scale. With time, you will be able to estimate serving sizes for some foods.  Take some time to put servings of different foods on your favorite plates, bowls, and cups so you know what a serving looks like.  Try not to eat straight from a bag or box. Doing this can lead to overeating. Put the amount you would like to eat in a cup or on a plate to make sure you are eating the right portion.  Use smaller plates, glasses, and bowls to prevent overeating.  Try not to multitask (for example, watch TV or use your computer) while eating. If it is time to eat, sit down at a table and enjoy your food. This will help you to know when you are full. It will also help you to be aware of what you are eating and how much you are eating. What are tips for following this plan? Reading food labels  Check the calorie count compared to the serving size. The serving size may be  smaller than what you are used to eating.  Check the source of the calories. Make sure the food you are eating is high in vitamins and protein and low in saturated and trans fats. Shopping  Read nutrition labels while you shop. This will help you make healthy decisions before you decide to purchase your food.  Make a grocery list and stick to it. Cooking  Try to cook your favorite foods in a healthier way. For example, try baking instead of frying.  Use low-fat dairy products. Meal planning  Use more fruits and vegetables.  Half of your plate should be fruits and vegetables.  Include lean proteins like poultry and fish. How do I count calories when eating out?  Ask for smaller portion sizes.  Consider sharing an entree and sides instead of getting your own entree.  If you get your own entree, eat only half. Ask for a box at the beginning of your meal and put the rest of your entree in it so you are not tempted to eat it.  If calories are listed on the menu, choose the lower calorie options.  Choose dishes that include vegetables, fruits, whole grains, low-fat dairy products, and lean protein.  Choose items that are boiled, broiled, grilled, or steamed. Stay away from items that are buttered, battered, fried, or served with cream sauce. Items labeled "crispy" are usually fried, unless stated otherwise.  Choose water, low-fat milk, unsweetened iced tea, or other drinks without added sugar. If you want an alcoholic beverage, choose a lower calorie option such as a glass of wine or light beer.  Ask for dressings, sauces, and syrups on the side. These are usually high in calories, so you should limit the amount you eat.  If you want a salad, choose a garden salad and ask for grilled meats. Avoid extra toppings like bacon, cheese, or fried items. Ask for the dressing on the side, or ask for olive oil and vinegar or lemon to use as dressing.  Estimate how many servings of a food you are  given. For example, a serving of cooked rice is  cup or about the size of half a baseball. Knowing serving sizes will help you be aware of how much food you are eating at restaurants. The list below tells you how big or small some common portion sizes are based on everyday objects: ? 1 oz--4 stacked dice. ? 3 oz--1 deck of cards. ? 1 tsp--1 die. ? 1 Tbsp-- a ping-pong ball. ? 2 Tbsp--1 ping-pong ball. ?  cup-- baseball. ? 1 cup--1 baseball. Summary  Calorie counting means keeping track of how many calories you eat and drink each day. If you eat fewer calories than your body needs, you should lose weight.  A healthy amount of weight to lose per week is usually 1-2 lb (0.5-0.9 kg). This usually means reducing your daily calorie intake by 500-750 calories.  The number of calories in a food can be found on a Nutrition Facts label. If a food does not have a Nutrition Facts label, try to look up the calories online or ask your dietitian for help.  Use your calories on foods and drinks that will fill you up, and not on foods and drinks that will leave you hungry.  Use smaller plates, glasses, and bowls to prevent overeating. This information is not intended to replace advice given to you by your health care provider. Make sure you discuss any questions you have with your health care provider. Document Revised: 04/01/2018 Document Reviewed: 06/12/2016 Elsevier Patient Education  2020 [employer].  Exercising to Lose Weight Exercise is structured, repetitive physical activity to improve fitness and health. Getting regular exercise is important for everyone. It is especially important if you are overweight. Being overweight increases your risk of heart disease, stroke, diabetes, high blood pressure, and several types of cancer. Reducing your calorie intake and exercising can help you lose weight. Exercise is usually categorized as moderate or vigorous intensity. To lose weight, most people need  to do a certain amount of moderate-intensity or vigorous-intensity  exercise each week. Moderate-intensity exercise  Moderate-intensity exercise is any activity that gets you moving enough to burn at least three times more energy (calories) than if you were sitting. Examples of moderate exercise include:  Walking a mile in 15 minutes.  Doing light yard work.  Biking at an easy pace. Most people should get at least 150 minutes (2 hours and 30 minutes) a week of moderate-intensity exercise to maintain their body weight. Vigorous-intensity exercise Vigorous-intensity exercise is any activity that gets you moving enough to burn at least six times more calories than if you were sitting. When you exercise at this intensity, you should be working hard enough that you are not able to carry on a conversation. Examples of vigorous exercise include:  Running.  Playing a team sport, such as football, basketball, and soccer.  Jumping rope. Most people should get at least 75 minutes (1 hour and 15 minutes) a week of vigorous-intensity exercise to maintain their body weight. How can exercise affect me? When you exercise enough to burn more calories than you eat, you lose weight. Exercise also reduces body fat and builds muscle. The more muscle you have, the more calories you burn. Exercise also:  Improves mood.  Reduces stress and tension.  Improves your overall fitness, flexibility, and endurance.  Increases bone strength. The amount of exercise you need to lose weight depends on:  Your age.  The type of exercise.  Any health conditions you have.  Your overall physical ability. Talk to your health care provider about how much exercise you need and what types of activities are safe for you. What actions can I take to lose weight? Nutrition   Make changes to your diet as told by your health care provider or diet and nutrition specialist (dietitian). This may include: ? Eating fewer  calories. ? Eating more protein. ? Eating less unhealthy fats. ? Eating a diet that includes fresh fruits and vegetables, whole grains, low-fat dairy products, and lean protein. ? Avoiding foods with added fat, salt, and sugar.  Drink plenty of water while you exercise to prevent dehydration or heat stroke. Activity  Choose an activity that you enjoy and set realistic goals. Your health care provider can help you make an exercise plan that works for you.  Exercise at a moderate or vigorous intensity most days of the week. ? The intensity of exercise may vary from person to person. You can tell how intense a workout is for you by paying attention to your breathing and heartbeat. Most people will notice their breathing and heartbeat get faster with more intense exercise.  Do resistance training twice each week, such as: ? Push-ups. ? Sit-ups. ? Lifting weights. ? Using resistance bands.  Getting short amounts of exercise can be just as helpful as long structured periods of exercise. If you have trouble finding time to exercise, try to include exercise in your daily routine. ? Get up, stretch, and walk around every 30 minutes throughout the day. ? Go for a walk during your lunch break. ? Park your car farther away from your destination. ? If you take public transportation, get off one stop early and walk the rest of the way. ? Make phone calls while standing up and walking around. ? Take the stairs instead of elevators or escalators.  Wear comfortable clothes and shoes with good support.  Do not exercise so much that you hurt yourself, feel dizzy, or get very short of breath. Where to find more information  [place] Department of Health and Human Services: ThisPath.fi  Centers for Disease Control and Prevention (CDC): FootballExhibition.com.br Contact a health care provider:  Before starting a new exercise program.  If you have questions or concerns about your weight.  If you have a medical  problem that keeps you from exercising. Get help right away if you have any of the following while exercising:  Injury.  Dizziness.  Difficulty breathing or shortness of breath that does not go away when you stop exercising.  Chest pain.  Rapid heartbeat. Summary  Being overweight increases your risk of heart disease, stroke, diabetes, high blood pressure, and several types of cancer.  Losing weight happens when you burn more calories than you eat.  Reducing the amount of calories you eat in addition to getting regular moderate or vigorous exercise each week helps you lose weight. This information is not intended to replace advice given to you by your health care provider. Make sure you discuss any questions you have with your health care provider. Document Revised: 07/26/2017 Document Reviewed: 07/26/2017 Elsevier Patient Education  2020 [employer].

## 2020-05-06 NOTE — Progress Notes (Signed)
Subjective:     Jenny Roberson is a 54 y.o. female here for a routine exam. G0 Current complaints: Pt is teary on exam She reports that her arms hurt from having her BP obtained twice in the office. She also reports being anxious about begin with a new provider. She is s/p a hyst for fibroids in her 53's. She reports a BSO at that time. She reprots that her hot flushes have become worse overtime.    Pts paternal GM had breat cancer. Her mother had breast cancer in her 52's.      Gynecologic History No LMP recorded. Patient has had a hysterectomy. Contraception: status post hysterectomy Last Pap: pt reports h/o abnormal PAP many years ago. Prior to her hyst.     Last mammogram: UTD. Pt has gotten mammograms since the age of 68.    Obstetric History OB History  Gravida Para Term Preterm AB Living  0 0 0 0 0 0  SAB TAB Ectopic Multiple Live Births  0 0 0 0 0   The following portions of the patient's history were reviewed and updated as appropriate: allergies, current medications, past family history, past medical history, past social history, past surgical history and problem list.  Review of Systems Pertinent items are noted in HPI.    Objective:  BP (!) 89/57   Pulse 90   Ht 5\' 7"  (1.702 m)   Wt 202 lb 0.6 oz (91.6 kg)   BMI 31.64 kg/m  General Appearance:    Alert, cooperative, no distress, appears stated age  Head:    Normocephalic, without obvious abnormality, atraumatic  Eyes:    conjunctiva/corneas clear, EOM's intact, both eyes  Ears:    Normal external ear canals, both ears  Nose:   Nares normal, septum midline, mucosa normal, no drainage    or sinus tenderness  Throat:   Lips, mucosa, and tongue normal; teeth and gums normal  Neck:   Supple, symmetrical, trachea midline, no adenopathy;    thyroid:  no enlargement/tenderness/nodules  Back:     Symmetric, no curvature, ROM normal, no CVA tenderness  Lungs:     respirations unlabored  Chest Wall:    No tenderness or  deformity   Heart:    Regular rate and rhythm  Breast Exam:    No tenderness, masses, or nipple abnormality  Abdomen:     Soft, non-tender, bowel sounds active all four quadrants,    no masses, no organomegaly  Genitalia:    Normal female without lesion, discharge or tenderness     Extremities:   Extremities normal, atraumatic, no cyanosis or edema  Pulses:   2+ and symmetric all extremities  Skin:   Skin color, texture, turgor normal, no rashes or lesions    Assessment:    Healthy female exam.   Hot flushes- I have reviewed with pt the treatment options availale to her. She has a strong FH of breast cancer and would like to avoid EES.  Pt would like to try Babapentin as the initial treatment options.     Plan:     Jenny Roberson was seen today for gynecologic exam.  Diagnoses and all orders for this visit:  Well female exam with routine gynecological exam  Weight gain  Menopausal hot flushes -     gabapentin (NEURONTIN) 300 MG capsule; Take 1 capsule (300 mg total) by mouth at bedtime.  f/u in 6 weeks remotely per pt choice to f/u on improvement of hot flushes and the response  to the Gabapentin.     Jenny Roberson L. Harraway-Smith, M.D., [place]

## 2020-05-22 ENCOUNTER — Other Ambulatory Visit: Payer: Self-pay | Admitting: Family Medicine

## 2020-05-22 DIAGNOSIS — F418 Other specified anxiety disorders: Secondary | ICD-10-CM

## 2020-06-05 ENCOUNTER — Ambulatory Visit: Admitting: Obstetrics & Gynecology

## 2020-08-29 IMAGING — MG MM DIGITAL SCREENING BILAT W/ TOMO W/ CAD
6 of 10 series · 6 of 30 positions shown · non-contrast
Comparison: None.

CLINICAL DATA: Screening.

EXAM:
DIGITAL SCREENING BILATERAL MAMMOGRAM WITH TOMO AND CAD

[L CC synth-2D]
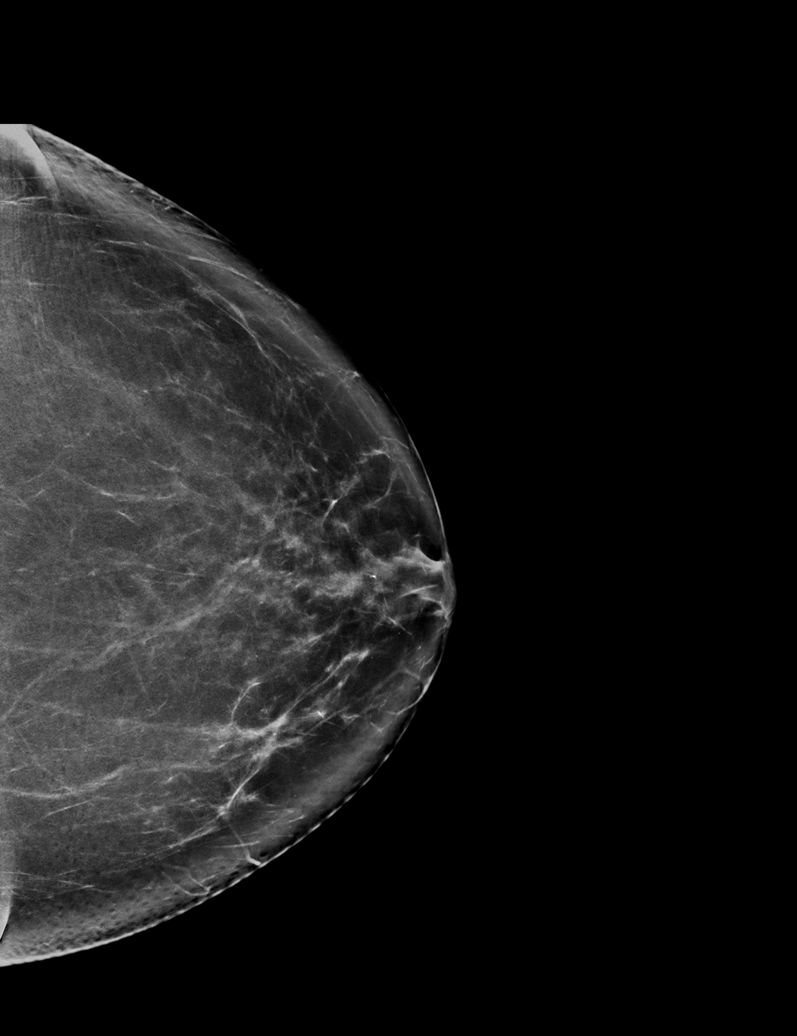

[L MLO synth-2D]
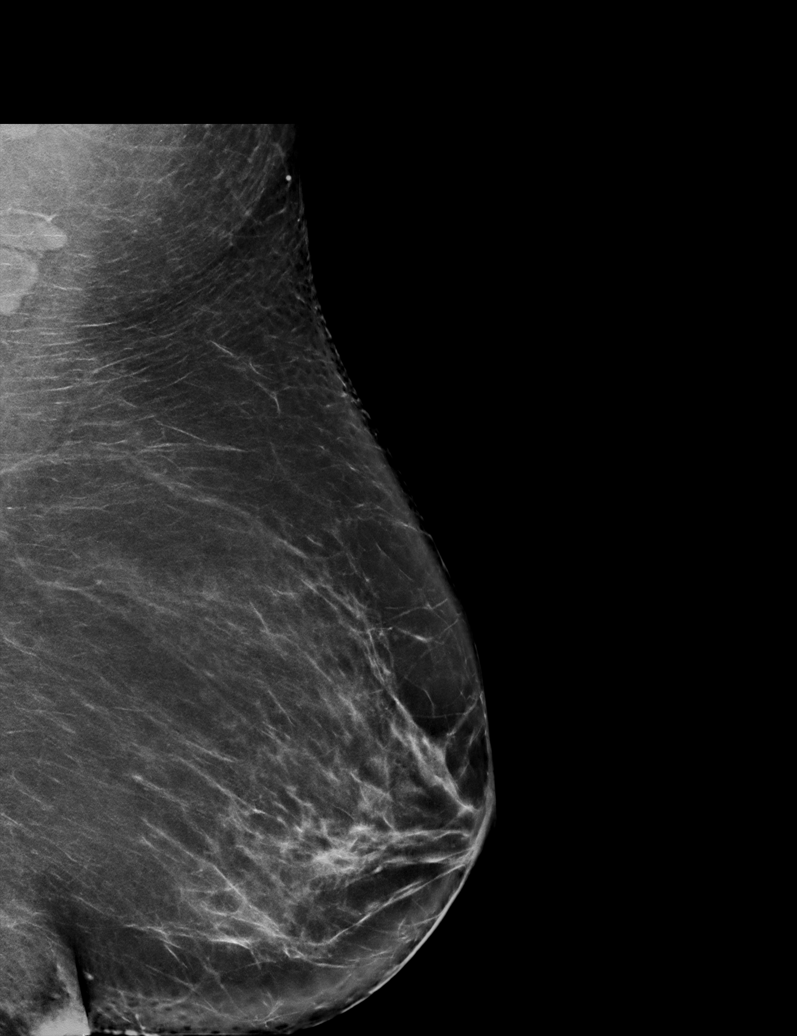

[R MLO synth-2D]
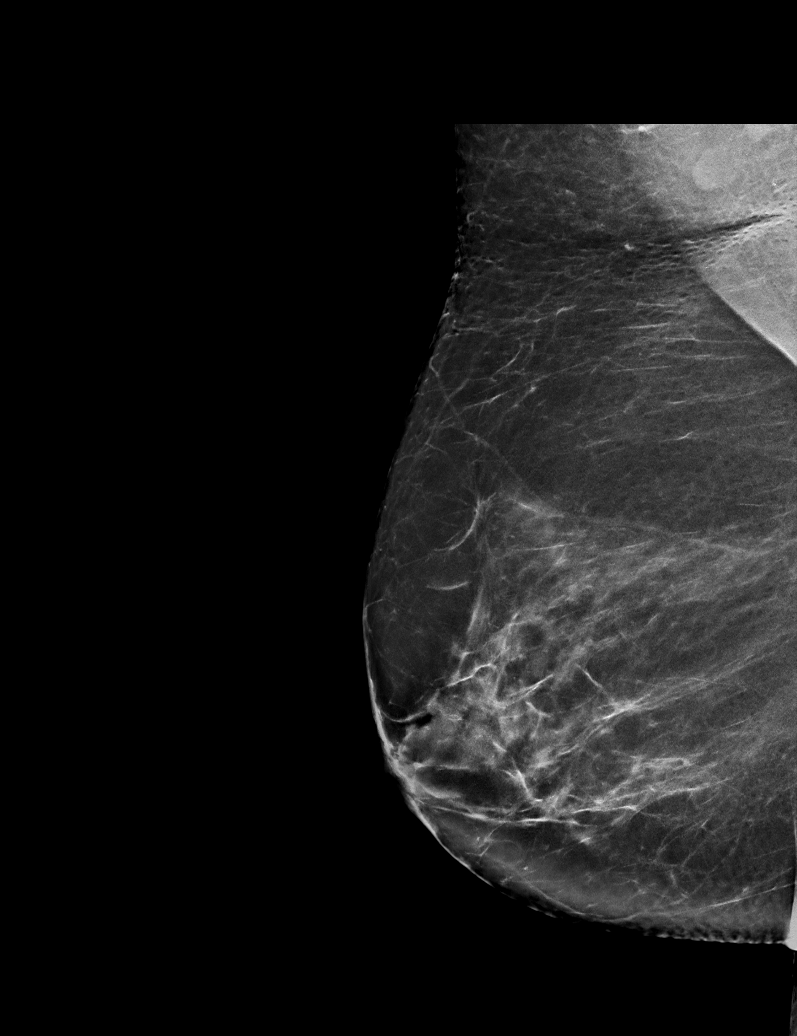

[R CC synth-2D]
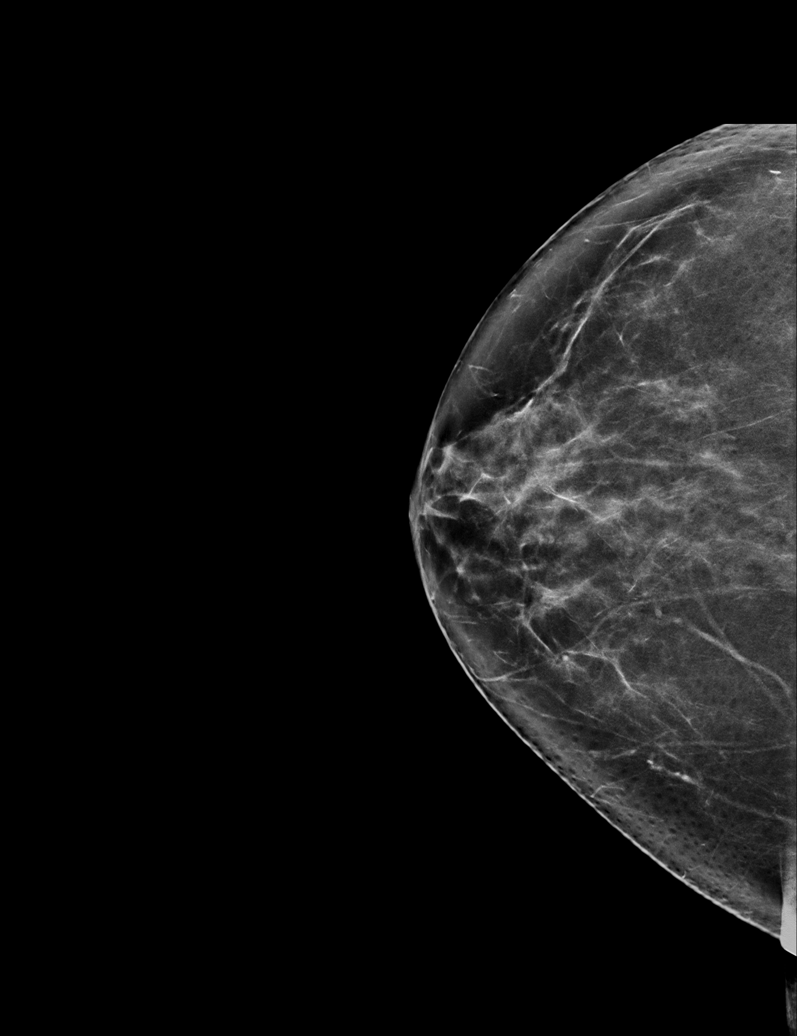

[L XCCL synth-2D]
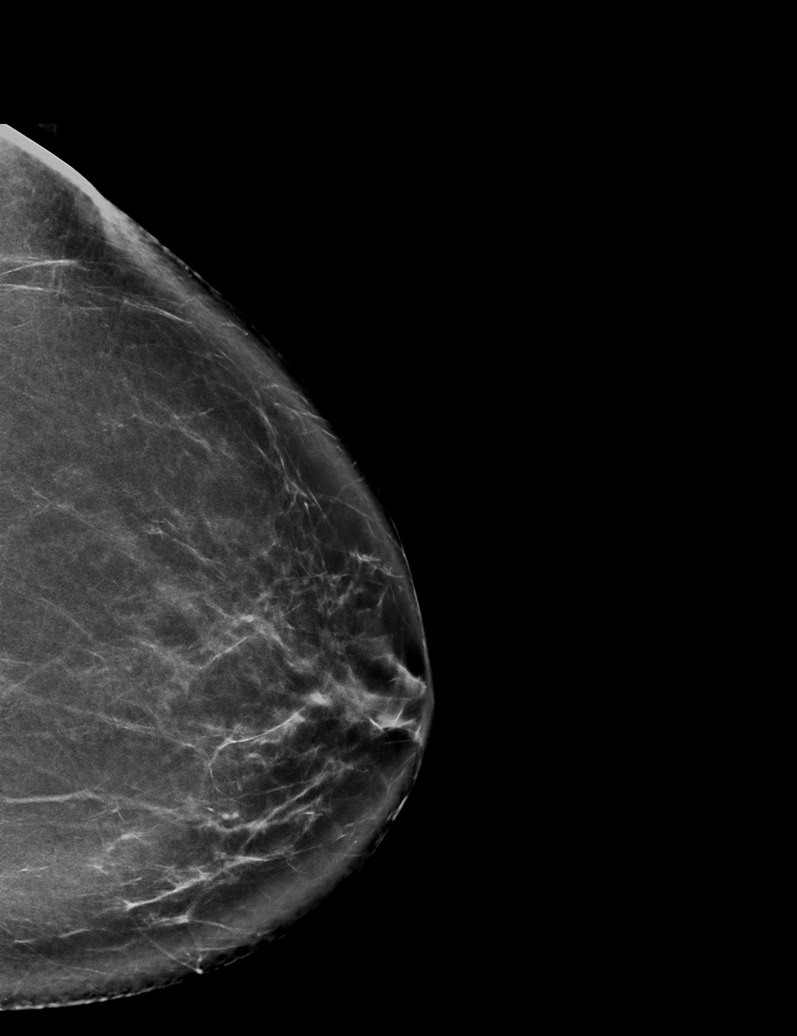

[R CC tomo · tomo slice 37/74.0]
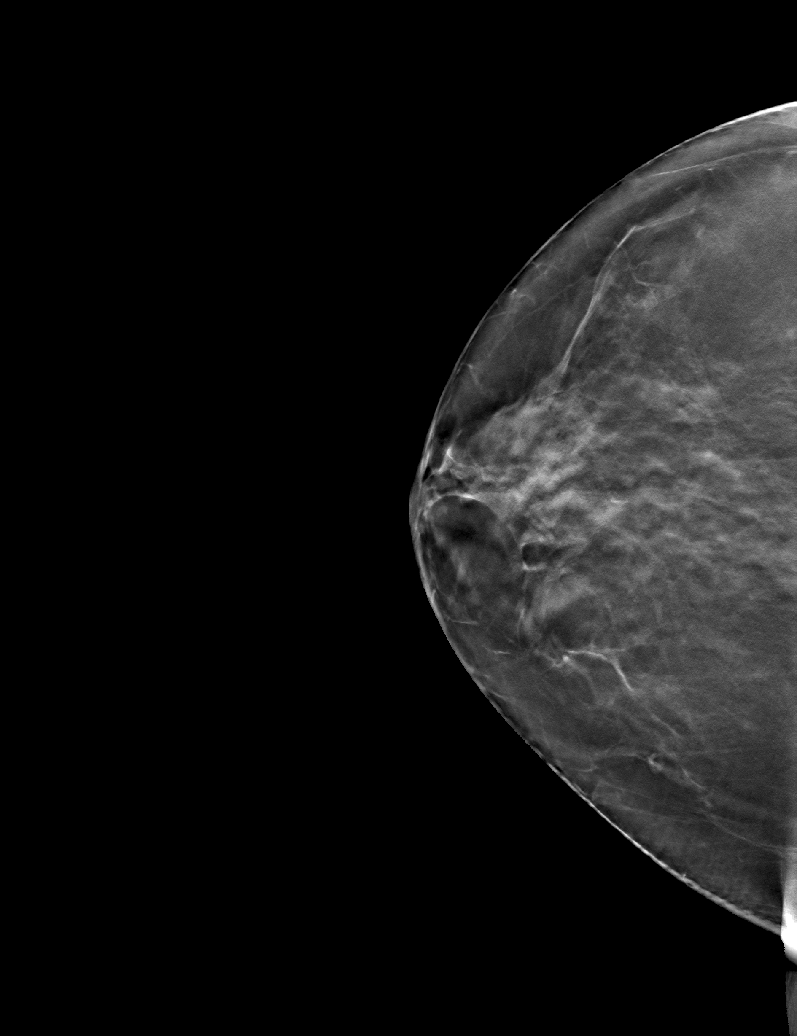

[6 of 30 positions shown; findings below may reference images not displayed]

ACR Breast Density Category b: There are scattered areas of
fibroglandular density.
FINDINGS: There are no findings suspicious for malignancy. Images were
processed with CAD.
IMPRESSION: No mammographic evidence of malignancy. A result letter of this
screening mammogram will be mailed directly to the patient.

RECOMMENDATION:
Screening mammogram in one year. (Code:[id number])

BI-RADS CATEGORY  1: Negative.

## 2020-09-16 ENCOUNTER — Ambulatory Visit: Admitting: Family Medicine

## 2020-10-16 ENCOUNTER — Encounter: Payer: Self-pay | Admitting: Family Medicine

## 2020-10-16 ENCOUNTER — Other Ambulatory Visit: Payer: Self-pay

## 2020-10-16 ENCOUNTER — Ambulatory Visit (INDEPENDENT_AMBULATORY_CARE_PROVIDER_SITE_OTHER): Admitting: Family Medicine

## 2020-10-16 VITALS — BP 110/62 | HR 81 | Temp 97.9°F | Ht 67.0 in | Wt 205.5 lb

## 2020-10-16 DIAGNOSIS — E785 Hyperlipidemia, unspecified: Secondary | ICD-10-CM

## 2020-10-16 DIAGNOSIS — Z1159 Encounter for screening for other viral diseases: Secondary | ICD-10-CM | POA: Diagnosis not present

## 2020-10-16 DIAGNOSIS — Z0000 Encounter for general adult medical examination without abnormal findings: Secondary | ICD-10-CM

## 2020-10-16 LAB — COMPREHENSIVE METABOLIC PANEL
ALT: 15 U/L (ref 0–35)
AST: 14 U/L (ref 0–37)
Albumin: 4.4 g/dL (ref 3.5–5.2)
Alkaline Phosphatase: 112 U/L (ref 39–117)
BUN: 16 mg/dL (ref 6–23)
CO2: 28 mEq/L (ref 19–32)
Calcium: 9.5 mg/dL (ref 8.4–10.5)
Chloride: 103 mEq/L (ref 96–112)
Creatinine, Ser: 0.79 mg/dL (ref 0.40–1.20)
GFR: 84.36 mL/min (ref >60.00)
Glucose, Bld: 103 mg/dL — ABNORMAL HIGH (ref 70–99)
Potassium: 3.9 mEq/L (ref 3.5–5.1)
Sodium: 139 mEq/L (ref 135–145)
Total Bilirubin: 0.4 mg/dL (ref 0.2–1.2)
Total Protein: 7.2 g/dL (ref 6.0–8.3)

## 2020-10-16 LAB — LIPID PANEL
Cholesterol: 167 mg/dL (ref 0–200)
HDL: 56.1 mg/dL (ref >39.00)
LDL Cholesterol: 87 mg/dL (ref 0–99)
NonHDL: 110.89
Total CHOL/HDL Ratio: 3
Triglycerides: 120 mg/dL (ref 0.0–149.0)
VLDL: 24 mg/dL (ref 0.0–40.0)

## 2020-10-16 NOTE — Progress Notes (Signed)
Chief Complaint  Patient presents with  . Annual Exam     Well Woman Jenny Roberson is here for a complete physical.   Her last physical was >1 year ago.  Current diet: in general, a "healthy" diet. Current exercise: walking. Weight is stable and she denies fatigue out of ordinary. Seatbelt? Yes Loss of interested in doing things or depression in past 2 weeks? No  Health Maintenance Pap/HPV- Yes Mammogram- Yes Colon cancer screening-Yes Shingrix- No Tetanus- Yes Hep C screening- No HIV screening- Yes  Past Medical History:  Diagnosis Date  . Depression   . Hyperlipidemia      Past Surgical History:  Procedure Laterality Date  . ABDOMINAL HYSTERECTOMY  2013  . OTHER SURGICAL HISTORY  2014   lipoma removal    Medications  Current Outpatient Medications on File Prior to Visit  Medication Sig Dispense Refill  . hydrOXYzine (ATARAX/VISTARIL) 25 MG tablet Take 1-3 tablets (25-75 mg total) by mouth 3 (three) times daily as needed for anxiety. 90 tablet 1  . pravastatin (PRAVACHOL) 20 MG tablet TAKE 1 TABLET DAILY 90 tablet 3  . venlafaxine XR (EFFEXOR-XR) 75 MG 24 hr capsule TAKE 1 CAPSULE(75 MG) BY MOUTH DAILY WITH BREAKFAST 30 capsule 1  . gabapentin (NEURONTIN) 300 MG capsule Take 1 capsule (300 mg total) by mouth at bedtime. 30 capsule 2   Allergies Allergies  Allergen Reactions  . Amoxicillin Other (See Comments)    Palms of hands and bottom of feet turned red/skin peeled  . Codeine Nausea And Vomiting    Review of Systems: Constitutional:  no unexpected weight changes Eye:  no recent significant change in vision Ear/Nose/Mouth/Throat:  Ears:  no recent change in hearing Nose/Mouth/Throat:  no complaints of nasal congestion, no sore throat Cardiovascular: no chest pain Respiratory:  no shortness of breath Gastrointestinal:  no abdominal pain, no change in bowel habits GU:  Female: negative for dysuria or pelvic pain Musculoskeletal/Extremities:  no pain of the  joints Integumentary (Skin/Breast):  no abnormal skin lesions reported Neurologic:  no headaches Endocrine:  denies fatigue  Exam BP 110/62 (BP Location: Left Arm, Patient Position: Sitting, Cuff Size: Normal)   Pulse 81   Temp 97.9 F (36.6 C) (Oral)   Ht 5\' 7"  (1.702 m)   Wt 205 lb 8 oz (93.2 kg)   SpO2 96%   BMI 32.19 kg/m  General:  well developed, well nourished, in no apparent distress Skin:  no significant moles, warts, or growths Head:  no masses, lesions, or tenderness Eyes:  pupils equal and round, sclera anicteric without injection Ears:  canals without lesions, TMs shiny without retraction, no obvious effusion, no erythema Nose:  nares patent, septum midline, mucosa normal, and no drainage or sinus tenderness Throat/Pharynx:  lips and gingiva without lesion; tongue and uvula midline; non-inflamed pharynx; no exudates or postnasal drainage Neck: neck supple without adenopathy, thyromegaly, or masses Lungs:  clear to auscultation, breath sounds equal bilaterally, no respiratory distress Cardio:  regular rate and rhythm, no LE edema Abdomen:  abdomen soft, nontender; bowel sounds normal; no masses or organomegaly Genital: Defer to GYN Musculoskeletal:  symmetrical muscle groups noted without atrophy or deformity Extremities:  no clubbing, cyanosis, or edema, no deformities, no skin discoloration Neuro:  gait normal; deep tendon reflexes normal and symmetric Psych: well oriented with normal range of affect and appropriate judgment/insight  Assessment and Plan  Well adult exam  Encounter for hepatitis C screening test for low Roberson patient - Plan: Hepatitis  C antibody  Hyperlipidemia, unspecified hyperlipidemia type - Plan: Comprehensive metabolic panel, Lipid panel   Well 55 y.o. female. Counseled on diet and exercise. Wt resistance exercise rec'd.  Other orders as above. Shingrix info given. Vit D and Ca rec'd.  Follow up in 6 mo. The patient voiced understanding  and agreement to the plan.  Jenny Roberson [place], DO 10/16/20 7:54 AM

## 2020-10-16 NOTE — Patient Instructions (Addendum)
Give [place] 2-3 business days to get the results of your labs back.   Keep the diet clean and stay active.  Consider weight resistance exercise to add to your routine.  Start taking 1200 mg of calcium daily and 1000 units of Vit D3 daily.   The new Shingrix vaccine (for shingles) is a 2 shot series. It can make people feel low energy, achy and almost like they have the flu for 48 hours after injection. Please plan accordingly when deciding on when to get this shot. Call our office for a nurse visit appointment to get this. The second shot of the series is less severe regarding the side effects, but it still lasts 48 hours.   Let us know if you need anything.

## 2020-10-17 LAB — HEPATITIS C ANTIBODY
Hepatitis C Ab: NONREACTIVE
SIGNAL TO CUT-OFF: 0.01 (ref <1.00)

## 2021-01-24 ENCOUNTER — Ambulatory Visit (INDEPENDENT_AMBULATORY_CARE_PROVIDER_SITE_OTHER)

## 2021-01-24 ENCOUNTER — Other Ambulatory Visit: Payer: Self-pay

## 2021-01-24 DIAGNOSIS — Z23 Encounter for immunization: Secondary | ICD-10-CM

## 2021-01-24 NOTE — Progress Notes (Signed)
Pt is here today for  shingrix vaccines. Pt was given shingrix vaccine in left deltoid. Pt tolerated well.

## 2021-02-14 ENCOUNTER — Encounter: Payer: Self-pay | Admitting: Family Medicine

## 2021-02-14 ENCOUNTER — Ambulatory Visit (INDEPENDENT_AMBULATORY_CARE_PROVIDER_SITE_OTHER): Admitting: Family Medicine

## 2021-02-14 ENCOUNTER — Other Ambulatory Visit: Payer: Self-pay

## 2021-02-14 VITALS — BP 112/64 | HR 70 | Temp 98.0°F | Ht 67.0 in | Wt 206.5 lb

## 2021-02-14 DIAGNOSIS — H6981 Other specified disorders of Eustachian tube, right ear: Secondary | ICD-10-CM

## 2021-02-14 MED ORDER — FLUTICASONE PROPIONATE 50 MCG/ACT NA SUSP: 2.0000 | Freq: Every day | NASAL | 6 refills | Status: DC

## 2021-02-14 NOTE — Progress Notes (Signed)
Chief Complaint  Patient presents with   Ear Pain    Pt is here for right ear fullness. Duration: 5 days Progression: unchanged Associated symptoms: slight hearing loss Denies: sore throat, congestion, post nasal drip, sneezing, sinus pressure, and low grade fevers, bleeding, or discharge from ear Treatment to date: OTC drops, flushing  Past Medical History:  Diagnosis Date   Depression    Hyperlipidemia     BP 112/64   Pulse 70   Temp 98 F (36.7 C) (Oral)   Ht 5\' 7"  (1.702 m)   Wt 206 lb 8 oz (93.7 kg)   SpO2 99%   BMI 32.34 kg/m  General: Awake, alert, appearing stated age HEENT:  L ear- Canal patent without drainage or erythema, TM is neg R ear- canal patent without drainage or erythema, TM is neg Nose- nares patent and without discharge Mouth- Lips, gums and dentition unremarkable, pharynx is without erythema or exudate Neck: No adenopathy Lungs: Normal effort, no accessory muscle use Psych: Age appropriate judgment and insight, normal mood and affect  Dysfunction of right eustachian tube - Plan: fluticasone (FLONASE) 50 MCG/ACT nasal spray  Instructed how to use Flonase. Give it a couple weeks to work. Offered steroid, understandably declined given no pain.  F/u prn.  Pt voiced understanding and agreement to the plan.  [place], DO 02/14/21 3:51 PM

## 2021-02-14 NOTE — Patient Instructions (Signed)
This should resolve over the next 10-14 days.   Flonase (fluticasone); nasal spray that is over the counter. 2 sprays each nostril, once daily. Aim towards the same side eye when you spray.  This is available OTC.  Let us know if you need anything.

## 2021-04-18 ENCOUNTER — Ambulatory Visit (INDEPENDENT_AMBULATORY_CARE_PROVIDER_SITE_OTHER): Admitting: Family Medicine

## 2021-04-18 ENCOUNTER — Encounter: Payer: Self-pay | Admitting: Family Medicine

## 2021-04-18 ENCOUNTER — Other Ambulatory Visit: Payer: Self-pay

## 2021-04-18 VITALS — BP 120/78 | HR 73 | Temp 98.1°F | Ht 67.0 in | Wt 209.0 lb

## 2021-04-18 DIAGNOSIS — Z23 Encounter for immunization: Secondary | ICD-10-CM | POA: Diagnosis not present

## 2021-04-18 DIAGNOSIS — E785 Hyperlipidemia, unspecified: Secondary | ICD-10-CM

## 2021-04-18 DIAGNOSIS — R790 Abnormal level of blood mineral: Secondary | ICD-10-CM

## 2021-04-18 DIAGNOSIS — F418 Other specified anxiety disorders: Secondary | ICD-10-CM | POA: Diagnosis not present

## 2021-04-18 LAB — IBC + FERRITIN
Ferritin: 6.1 ng/mL — ABNORMAL LOW (ref 10.0–291.0)
Iron: 78 ug/dL (ref 42–145)
Saturation Ratios: 17.1 % — ABNORMAL LOW (ref 20.0–50.0)
TIBC: 456.4 ug/dL — ABNORMAL HIGH (ref 250.0–450.0)
Transferrin: 326 mg/dL (ref 212.0–360.0)

## 2021-04-18 LAB — CBC
HCT: 32.3 % — ABNORMAL LOW (ref 36.0–46.0)
Hemoglobin: 10.8 g/dL — ABNORMAL LOW (ref 12.0–15.0)
MCHC: 33.4 g/dL (ref 30.0–36.0)
MCV: 89.6 fl (ref 78.0–100.0)
Platelets: 216 10*3/uL (ref 150.0–400.0)
RBC: 3.6 Mil/uL — ABNORMAL LOW (ref 3.87–5.11)
RDW: 13.1 % (ref 11.5–15.5)
WBC: 4.8 10*3/uL (ref 4.0–10.5)

## 2021-04-18 LAB — LIPID PANEL
Cholesterol: 152 mg/dL (ref 0–200)
HDL: 54.9 mg/dL (ref >39.00)
LDL Cholesterol: 70 mg/dL (ref 0–99)
NonHDL: 97.55
Total CHOL/HDL Ratio: 3
Triglycerides: 136 mg/dL (ref 0.0–149.0)
VLDL: 27.2 mg/dL (ref 0.0–40.0)

## 2021-04-18 MED ORDER — VENLAFAXINE HCL ER 75 MG PO CP24: 75.0000 mg | ORAL_CAPSULE | Freq: Every day | ORAL | 2 refills | Status: DC

## 2021-04-18 MED ORDER — PRAVASTATIN SODIUM 20 MG PO TABS: 20.0000 mg | ORAL_TABLET | Freq: Every day | ORAL | 3 refills | Status: DC

## 2021-04-18 MED ORDER — HYDROXYZINE HCL 25 MG PO TABS
25.0000 mg | ORAL_TABLET | Freq: Three times a day (TID) | ORAL | 5 refills | Status: DC | PRN
Start: 2021-04-18 — End: 2023-05-04

## 2021-04-18 NOTE — Progress Notes (Signed)
Chief Complaint  Patient presents with   Follow-up    Subjective Jenny Roberson presents for f/u anxiety/depression.  Pt is currently being treated with Effexor XR 75 mg/d, hydroxyzine.  Uses latter 1x/mo and it works well.  Reports continuing to do well since treatment. No thoughts of harming self or others. No self-medication with alcohol, prescription drugs or illicit drugs. Pt is not following with a counselor/psychologist.  Hyperlipidemia Patient presents for dyslipidemia follow up. Currently being treated with pravastatin 20 mg/d and compliance with treatment thus far has been good. She denies myalgias. She is adhering to a healthy diet. Exercise: walking The patient is not known to have coexisting coronary artery disease. No Cp or SOB.   Past Medical History:  Diagnosis Date   Depression    Hyperlipidemia    Allergies as of 04/18/2021       Reactions   Amoxicillin Other (See Comments)   Palms of hands and bottom of feet turned red/skin peeled   Codeine Nausea And Vomiting        Medication List        Accurate as of April 18, 2021  8:21 AM. If you have any questions, ask your nurse or doctor.          fluticasone 50 MCG/ACT nasal spray Commonly known as: FLONASE Place 2 sprays into both nostrils daily.   hydrOXYzine 25 MG tablet Commonly known as: ATARAX/VISTARIL Take 1-3 tablets (25-75 mg total) by mouth 3 (three) times daily as needed for anxiety.   pravastatin 20 MG tablet Commonly known as: PRAVACHOL Take 1 tablet (20 mg total) by mouth daily.   venlafaxine XR 75 MG 24 hr capsule Commonly known as: EFFEXOR-XR Take 1 capsule (75 mg total) by mouth daily with breakfast. What changed: See the new instructions. Changed by: Sharlene Dory, DO        Exam BP 120/78   Pulse 73   Temp 98.1 F (36.7 C) (Oral)   Ht 5\' 7"  (1.702 m)   Wt 209 lb (94.8 kg)   SpO2 98%   BMI 32.73 kg/m  General:  well developed, well nourished, in  no apparent distress Lungs:  No respiratory distress Psych: well oriented with normal range of affect and age-appropriate judgement/insight, alert and oriented x4.  Assessment and Plan  Anxiety with depression - Plan: hydrOXYzine (ATARAX/VISTARIL) 25 MG tablet, venlafaxine XR (EFFEXOR-XR) 75 MG 24 hr capsule  Hyperlipidemia, unspecified hyperlipidemia type - Plan: pravastatin (PRAVACHOL) 20 MG tablet, Lipid panel  Low ferritin - Plan: CBC, IBC + Ferritin  Chronic, stable. Cont Effexor XR 75 mg/d, hydroxyzine prn.  Chronic, stable. Cont pravastatin 20 mg/d. Ck lipids today. Also need to f/u on low ferritin as above.  F/u in 6 mo or prn. The patient voiced understanding and agreement to the plan.  Annetta North, DO 04/18/21 8:21 AM

## 2021-04-18 NOTE — Patient Instructions (Signed)
Give us 2-3 business days to get the results of your labs back.   Keep the diet clean and stay active.  Aim to do some physical exertion for 150 minutes per week. This is typically divided into 5 days per week, 30 minutes per day. The activity should be enough to get your heart rate up. Anything is better than nothing if you have time constraints.  Let us know if you need anything.  

## 2021-04-18 NOTE — Addendum Note (Signed)
Addended by: Scharlene Gloss B on: 04/18/2021 08:38 AM   Modules accepted: Orders

## 2021-04-21 ENCOUNTER — Other Ambulatory Visit: Payer: Self-pay | Admitting: Family Medicine

## 2021-04-21 DIAGNOSIS — K625 Hemorrhage of anus and rectum: Secondary | ICD-10-CM

## 2021-04-21 DIAGNOSIS — R790 Abnormal level of blood mineral: Secondary | ICD-10-CM

## 2021-04-23 ENCOUNTER — Other Ambulatory Visit (INDEPENDENT_AMBULATORY_CARE_PROVIDER_SITE_OTHER)

## 2021-04-23 ENCOUNTER — Other Ambulatory Visit: Payer: Self-pay

## 2021-04-23 DIAGNOSIS — R790 Abnormal level of blood mineral: Secondary | ICD-10-CM

## 2021-04-23 LAB — URINALYSIS, MICROSCOPIC ONLY: RBC / HPF: NONE SEEN (ref 0–?)

## 2021-04-28 ENCOUNTER — Other Ambulatory Visit: Payer: Self-pay | Admitting: Family Medicine

## 2021-04-28 DIAGNOSIS — Z1231 Encounter for screening mammogram for malignant neoplasm of breast: Secondary | ICD-10-CM

## 2021-04-29 NOTE — Addendum Note (Signed)
Addended by: Mervin Kung A on: 04/29/2021 04:08 PM   Modules accepted: Orders

## 2021-04-30 ENCOUNTER — Other Ambulatory Visit: Payer: Self-pay

## 2021-04-30 ENCOUNTER — Telehealth: Payer: Self-pay | Admitting: *Deleted

## 2021-04-30 ENCOUNTER — Ambulatory Visit (INDEPENDENT_AMBULATORY_CARE_PROVIDER_SITE_OTHER)

## 2021-04-30 ENCOUNTER — Other Ambulatory Visit (INDEPENDENT_AMBULATORY_CARE_PROVIDER_SITE_OTHER)

## 2021-04-30 DIAGNOSIS — Z1231 Encounter for screening mammogram for malignant neoplasm of breast: Secondary | ICD-10-CM

## 2021-04-30 DIAGNOSIS — K625 Hemorrhage of anus and rectum: Secondary | ICD-10-CM

## 2021-04-30 DIAGNOSIS — R790 Abnormal level of blood mineral: Secondary | ICD-10-CM

## 2021-04-30 DIAGNOSIS — R195 Other fecal abnormalities: Secondary | ICD-10-CM

## 2021-04-30 LAB — FECAL OCCULT BLOOD, IMMUNOCHEMICAL: Fecal Occult Bld: POSITIVE — AB

## 2021-04-30 NOTE — Telephone Encounter (Signed)
Received call from [facility] at [facility] lab reporting positive IFOB. Please advise in PCP's absence.

## 2021-04-30 NOTE — Telephone Encounter (Signed)
Anemia, iron deficiency and now + I fob. Jenny Roberson, Arrange a GI referral, DX anemia. Let the patient know.

## 2021-04-30 NOTE — Telephone Encounter (Signed)
Mychart message sent. GI referral placed.  

## 2021-05-05 ENCOUNTER — Other Ambulatory Visit: Payer: Self-pay | Admitting: Family Medicine

## 2021-05-05 DIAGNOSIS — R928 Other abnormal and inconclusive findings on diagnostic imaging of breast: Secondary | ICD-10-CM

## 2021-05-06 ENCOUNTER — Other Ambulatory Visit: Payer: Self-pay

## 2021-05-06 DIAGNOSIS — R928 Other abnormal and inconclusive findings on diagnostic imaging of breast: Secondary | ICD-10-CM

## 2021-05-23 ENCOUNTER — Other Ambulatory Visit

## 2021-05-23 ENCOUNTER — Encounter

## 2021-06-11 ENCOUNTER — Telehealth: Admitting: Nurse Practitioner

## 2021-06-11 DIAGNOSIS — U071 COVID-19: Secondary | ICD-10-CM

## 2021-06-11 MED ORDER — PROMETHAZINE-DM 6.25-15 MG/5ML PO SYRP: 5.0000 mL | ORAL_SOLUTION | Freq: Four times a day (QID) | ORAL | 0 refills | Status: DC | PRN

## 2021-06-11 NOTE — Progress Notes (Signed)
Virtual Visit Consent   Jenny Roberson, you are scheduled for a virtual visit with Mary-Margaret Daphine Deutscher, FNP, a [facility] provider, today.     Just as with appointments in the office, your consent must be obtained to participate.  Your consent will be active for this visit and any virtual visit you may have with one of our providers in the next 365 days.     If you have a MyChart account, a copy of this consent can be sent to you electronically.  All virtual visits are billed to your insurance company just like a traditional visit in the office.    As this is a virtual visit, video technology does not allow for your provider to perform a traditional examination.  This may limit your provider's ability to fully assess your condition.  If your provider identifies any concerns that need to be evaluated in person or the need to arrange testing (such as labs, EKG, etc.), we will make arrangements to do so.     Although advances in technology are sophisticated, we cannot ensure that it will always work on either your end or our end.  If the connection with a video visit is poor, the visit may have to be switched to a telephone visit.  With either a video or telephone visit, we are not always able to ensure that we have a secure connection.     I need to obtain your verbal consent now.   Are you willing to proceed with your visit today? YES   Tarena Gockley has provided verbal consent on 06/11/2021 for a virtual visit (video or telephone).   Mary-Margaret Daphine Deutscher, FNP   Date: 06/11/2021 12:48 PM   Virtual Visit via Video Note   I, Mary-Margaret Daphine Deutscher, connected with Jenny Roberson ([id number], Aug 07, 1965) on 06/11/21 at  1:00 PM EST by a video-enabled telemedicine application and verified that I am speaking with the correct person using two identifiers.  Location: Patient: Virtual Visit Location Patient: Home Provider: Virtual Visit Location Provider: Mobile   I discussed the limitations of  evaluation and management by telemedicine and the availability of in person appointments. The patient expressed understanding and agreed to proceed.    History of Present Illness: Jenny Roberson is a 55 y.o. who identifies as a female who was assigned female at birth, and is being seen today for covid positive.  HPI: Patient states that she started with chills and cough, with fever of 100.7. SHe tested positive for covid on Saturday and again on Sunday. She has been using robitussin, and therauflu. Today she feels some bbetter with slight sore thorat and cough. Review of Systems  Constitutional:  Positive for chills, fever and malaise/fatigue.  HENT:  Positive for congestion and sore throat.   Respiratory:  Positive for cough (slight).   Musculoskeletal:  Positive for myalgias.  Neurological:  Negative for headaches.    Problems:  Patient Active Problem List   Diagnosis Date Noted   Anxiety with depression 02/17/2019   Hyperlipidemia 02/17/2019    Allergies:  Allergies  Allergen Reactions   Amoxicillin Other (See Comments)    Palms of hands and bottom of feet turned red/skin peeled   Codeine Nausea And Vomiting   Medications:  Current Outpatient Medications:    fluticasone (FLONASE) 50 MCG/ACT nasal spray, Place 2 sprays into both nostrils daily., Disp: 16 g, Rfl: 6   hydrOXYzine (ATARAX/VISTARIL) 25 MG tablet, Take 1-3 tablets (25-75 mg total) by mouth 3 (three) times daily  as needed for anxiety., Disp: 30 tablet, Rfl: 5   pravastatin (PRAVACHOL) 20 MG tablet, Take 1 tablet (20 mg total) by mouth daily., Disp: 90 tablet, Rfl: 3   venlafaxine XR (EFFEXOR-XR) 75 MG 24 hr capsule, Take 1 capsule (75 mg total) by mouth daily with breakfast., Disp: 90 capsule, Rfl: 2  Observations/Objective: Patient is well-developed, well-nourished in no acute distress.  Resting comfortably  at home.  Head is normocephalic, atraumatic.  No labored breathing.  Speech is clear and coherent with logical  content.  Patient is alert and oriented at baseline.  Deep cough noted during visit  Assessment and Plan:  Jenny Roberson in today with chief complaint of Covid Positive   1. Lab test positive for detection of COVID-19 virus Force fluids Rest Cough can linger for several weeks Patient does not want antiviral.  Meds ordered this encounter  Medications   promethazine-dextromethorphan (PROMETHAZINE-DM) 6.25-15 MG/5ML syrup    Sig: Take 5 mLs by mouth 4 (four) times daily as needed for cough.    Dispense:  118 mL    Refill:  0    Order Specific Question:   Supervising Provider    Answer:   Eber Hong [[id number]]       Follow Up Instructions: I discussed the assessment and treatment plan with the patient. The patient was provided an opportunity to ask questions and all were answered. The patient agreed with the plan and demonstrated an understanding of the instructions.  A copy of instructions were sent to the patient via MyChart.  The patient was advised to call back or seek an in-person evaluation if the symptoms worsen or if the condition fails to improve as anticipated.  Time:  I spent 10 minutes with the patient via telehealth technology discussing the above problems/concerns.    Mary-Margaret Daphine Deutscher, FNP

## 2021-06-11 NOTE — Progress Notes (Signed)
I am not able to treat you with antiviral in an evisit. You have to do a video visit. Please go to your my chart and instead of doing an evisit salect urgent care video visit so that you can get started on prper medication

## 2021-10-21 ENCOUNTER — Encounter: Payer: Self-pay | Admitting: Family Medicine

## 2021-10-21 ENCOUNTER — Ambulatory Visit (INDEPENDENT_AMBULATORY_CARE_PROVIDER_SITE_OTHER): Admitting: Family Medicine

## 2021-10-21 VITALS — BP 112/62 | HR 66 | Temp 98.0°F | Ht 67.0 in | Wt 212.1 lb

## 2021-10-21 DIAGNOSIS — Z0000 Encounter for general adult medical examination without abnormal findings: Secondary | ICD-10-CM | POA: Diagnosis not present

## 2021-10-21 LAB — CBC
HCT: 35.5 % — ABNORMAL LOW (ref 36.0–46.0)
Hemoglobin: 11.9 g/dL — ABNORMAL LOW (ref 12.0–15.0)
MCHC: 33.6 g/dL (ref 30.0–36.0)
MCV: 92.4 fl (ref 78.0–100.0)
Platelets: 205 10*3/uL (ref 150.0–400.0)
RBC: 3.84 Mil/uL — ABNORMAL LOW (ref 3.87–5.11)
RDW: 13.6 % (ref 11.5–15.5)
WBC: 4.8 10*3/uL (ref 4.0–10.5)

## 2021-10-21 LAB — COMPREHENSIVE METABOLIC PANEL
ALT: 22 U/L (ref 0–35)
AST: 17 U/L (ref 0–37)
Albumin: 4.3 g/dL (ref 3.5–5.2)
Alkaline Phosphatase: 106 U/L (ref 39–117)
BUN: 17 mg/dL (ref 6–23)
CO2: 27 mEq/L (ref 19–32)
Calcium: 9.3 mg/dL (ref 8.4–10.5)
Chloride: 104 mEq/L (ref 96–112)
Creatinine, Ser: 0.78 mg/dL (ref 0.40–1.20)
GFR: 85.05 mL/min (ref >60.00)
Glucose, Bld: 98 mg/dL (ref 70–99)
Potassium: 3.9 mEq/L (ref 3.5–5.1)
Sodium: 137 mEq/L (ref 135–145)
Total Bilirubin: 0.4 mg/dL (ref 0.2–1.2)
Total Protein: 7.2 g/dL (ref 6.0–8.3)

## 2021-10-21 LAB — LIPID PANEL
Cholesterol: 168 mg/dL (ref 0–200)
HDL: 53.9 mg/dL (ref >39.00)
LDL Cholesterol: 84 mg/dL (ref 0–99)
NonHDL: 113.94
Total CHOL/HDL Ratio: 3
Triglycerides: 150 mg/dL — ABNORMAL HIGH (ref 0.0–149.0)
VLDL: 30 mg/dL (ref 0.0–40.0)

## 2021-10-21 NOTE — Progress Notes (Signed)
Chief Complaint  ?Patient presents with  ? Annual Exam  ?  ? ?Well Woman ?Jenny Roberson is here for a complete physical.   ?Her last physical was >1 year ago.  ?Current diet: in general, a "healthy" diet. ?Current exercise: none lately. ?Weight is stable and she denies fatigue out of ordinary. ?Seatbelt? Yes ?Advanced directive? No ? ?Health Maintenance ?Mammogram- Yes ?Colon cancer screening-Yes ?Shingrix- Yes ?Tetanus- Yes ?Hep C screening- Yes ?HIV screening- Yes ? ?Past Medical History:  ?Diagnosis Date  ? Depression   ? Hyperlipidemia   ?  ? ?Past Surgical History:  ?Procedure Laterality Date  ? ABDOMINAL HYSTERECTOMY  2013  ? OTHER SURGICAL HISTORY  2014  ? lipoma removal  ? ? ?Medications  ?Current Outpatient Medications on File Prior to Visit  ?Medication Sig Dispense Refill  ? hydrOXYzine (ATARAX/VISTARIL) 25 MG tablet Take 1-3 tablets (25-75 mg total) by mouth 3 (three) times daily as needed for anxiety. 30 tablet 5  ? pravastatin (PRAVACHOL) 20 MG tablet Take 1 tablet (20 mg total) by mouth daily. 90 tablet 3  ? venlafaxine XR (EFFEXOR-XR) 75 MG 24 hr capsule Take 1 capsule (75 mg total) by mouth daily with breakfast. 90 capsule 2  ? ?Allergies ?Allergies  ?Allergen Reactions  ? Amoxicillin Other (See Comments)  ?  Palms of hands and bottom of feet turned red/skin peeled  ? Codeine Nausea And Vomiting  ? ? ?Review of Systems: ?Constitutional:  no unexpected weight changes ?Eye:  no recent significant change in vision ?Ear/Nose/Mouth/Throat:  Ears:  no recent change in hearing ?Nose/Mouth/Throat:  no complaints of nasal congestion, no sore throat ?Cardiovascular: no chest pain ?Respiratory:  no shortness of breath ?Gastrointestinal:  no abdominal pain, no change in bowel habits ?GU:  Female: negative for dysuria or pelvic pain ?Musculoskeletal/Extremities:  no pain of the joints ?Integumentary (Skin/Breast):  no abnormal skin lesions reported ?Neurologic:  no headaches ?Endocrine:  denies  fatigue ? ?Exam ?BP 112/62   Pulse 66   Temp 98 ?F (36.7 ?C) (Oral)   Ht 5\' 7"  (1.702 m)   Wt 212 lb 2 oz (96.2 kg)   SpO2 98%   BMI 33.22 kg/m?  ?General:  well developed, well nourished, in no apparent distress ?Skin:  no significant moles, warts, or growths ?Head:  no masses, lesions, or tenderness ?Eyes:  pupils equal and round, sclera anicteric without injection ?Ears:  canals without lesions, TMs shiny without retraction, no obvious effusion, no erythema ?Nose:  nares patent, septum midline, mucosa normal, and no drainage or sinus tenderness ?Throat/Pharynx:  lips and gingiva without lesion; tongue and uvula midline; non-inflamed pharynx; no exudates or postnasal drainage ?Neck: neck supple without adenopathy, thyromegaly, or masses ?Lungs:  clear to auscultation, breath sounds equal bilaterally, no respiratory distress ?Cardio:  regular rate and rhythm, no LE edema ?Abdomen:  abdomen soft, nontender; bowel sounds normal; no masses or organomegaly ?Genital: Defer to GYN ?Musculoskeletal:  symmetrical muscle groups noted without atrophy or deformity ?Extremities:  no clubbing, cyanosis, or edema, no deformities, no skin discoloration ?Neuro:  gait normal; deep tendon reflexes normal and symmetric ?Psych: well oriented with normal range of affect and appropriate judgment/insight ? ?Assessment and Plan ? ?Well adult exam - Plan: CBC, Comprehensive metabolic panel, Lipid panel  ? ?Well 56 y.o. female. ?Counseled on diet and exercise. ?Other orders as above. ?Advanced directive form provided today.  ?Follow up in 6 mo. ?The patient voiced understanding and agreement to the plan. ? ?59, DO ?  10/21/21 ?9:57 AM ? ?

## 2021-10-21 NOTE — Patient Instructions (Signed)
Give us 2-3 business days to get the results of your labs back.   Keep the diet clean and stay active.  Please get me a copy of your advanced directive form at your convenience.   Let us know if you need anything.  

## 2021-10-22 ENCOUNTER — Encounter: Admitting: Family Medicine

## 2021-11-05 ENCOUNTER — Telehealth: Payer: Self-pay | Admitting: Family Medicine

## 2021-11-05 NOTE — Telephone Encounter (Signed)
Chala from [facility] called stating that they had faxed over forms requesting a diagnosis mammogram order sent over to them. Confirmed that they had the correct fax # and requested them to refax it.  ?

## 2021-11-05 NOTE — Telephone Encounter (Signed)
Once receive will have PCP sign/then fax ?

## 2021-11-20 LAB — HM MAMMOGRAPHY

## 2021-11-21 ENCOUNTER — Encounter: Payer: Self-pay | Admitting: Family Medicine

## 2022-02-24 ENCOUNTER — Other Ambulatory Visit: Payer: Self-pay | Admitting: Family Medicine

## 2022-02-24 DIAGNOSIS — F418 Other specified anxiety disorders: Secondary | ICD-10-CM

## 2022-04-14 ENCOUNTER — Ambulatory Visit
Admission: RE | Admit: 2022-04-14 | Discharge: 2022-04-14 | Disposition: A | Discharge status: Home / Self Care | Source: Ambulatory Visit | Attending: Family Medicine | Admitting: Family Medicine

## 2022-04-14 VITALS — BP 138/85 | HR 90 | Temp 98.7°F | Resp 18

## 2022-04-14 DIAGNOSIS — R059 Cough, unspecified: Secondary | ICD-10-CM

## 2022-04-14 DIAGNOSIS — J309 Allergic rhinitis, unspecified: Secondary | ICD-10-CM

## 2022-04-14 DIAGNOSIS — J0100 Acute maxillary sinusitis, unspecified: Secondary | ICD-10-CM | POA: Diagnosis not present

## 2022-04-14 MED ORDER — BENZONATATE 200 MG PO CAPS: 200.0000 mg | ORAL_CAPSULE | Freq: Three times a day (TID) | ORAL | 0 refills | Status: AC | PRN

## 2022-04-14 MED ORDER — FEXOFENADINE HCL 180 MG PO TABS: 180.0000 mg | ORAL_TABLET | Freq: Every day | ORAL | 0 refills | Status: DC

## 2022-04-14 MED ORDER — AZITHROMYCIN 250 MG PO TABS: 250.0000 mg | ORAL_TABLET | Freq: Every day | ORAL | 0 refills | Status: DC

## 2022-04-14 NOTE — Discharge Instructions (Addendum)
Advised patient to take medication as directed with food to completion.  Advised patient to take Allegra with Zithromax daily for the next 5 days.  Advised may use Allegra as needed afterwards for concurrent postnasal drainage/drip.  Advised may use Tessalon Perles daily or as needed for cough.  Encourage patient to increase daily water intake while taking these medications.  Advised patient if symptoms worsen please follow-up with PCP or go to nearest ED for further evaluation.

## 2022-04-14 NOTE — ED Triage Notes (Signed)
Pt c/o cough, nasal congestion and sore throat that started Friday. Denies fever. NEG at home covid test on Sat and Sun. Taking Mucinex DM  and theraflu prn.

## 2022-04-14 NOTE — ED Provider Notes (Signed)
[facility]    CSN: [id number] Arrival date & time: 04/14/22  1045      History   Chief Complaint Chief Complaint  Patient presents with   Sore Throat   Nasal Congestion   Cough    HPI Jenny Roberson is a 56 y.o. female.   HPI 56 year old female presents with cough, nasal congestion, and sore throat for 4 days.  Patient denies fever patient reports negative home COVID-19 test on 9/16 and 04/12/2022.  Patient reports taking OTC Mucinex DM and TheraFlu as needed.  PMH significant for obesity, depression, and HLD. Patient is accompanied by her husband this morning.  Upon entering exam room today patient is crying and upset about apparent sore throat currently rated as 5 out of 10 and states" please wait for my husband to return from bathroom comes back in the room so he can talk to you about my current symptoms."  Past Medical History:  Diagnosis Date   Depression    Hyperlipidemia     Patient Active Problem List   Diagnosis Date Noted   Anxiety with depression 02/17/2019   Hyperlipidemia 02/17/2019    Past Surgical History:  Procedure Laterality Date   ABDOMINAL HYSTERECTOMY  2013   OTHER SURGICAL HISTORY  2014   lipoma removal    OB History     Gravida  0   Para  0   Term  0   Preterm  0   AB  0   Living  0      SAB  0   IAB  0   Ectopic  0   Multiple  0   Live Births  0            Home Medications    Prior to Admission medications   Medication Sig Start Date End Date Taking? Authorizing Provider  azithromycin (ZITHROMAX) 250 MG tablet Take 1 tablet (250 mg total) by mouth daily. Take first 2 tablets together, then 1 every day until finished. 04/14/22  Yes Eliezer Lofts, FNP  benzonatate (TESSALON) 200 MG capsule Take 1 capsule (200 mg total) by mouth 3 (three) times daily as needed for up to 7 days. 04/14/22 04/21/22 Yes Eliezer Lofts, FNP  fexofenadine [facility] ALLERGY) 180 MG tablet Take 1 tablet (180 mg total) by mouth daily  for 15 days. 04/14/22 04/29/22 Yes Eliezer Lofts, FNP  hydrOXYzine (ATARAX/VISTARIL) 25 MG tablet Take 1-3 tablets (25-75 mg total) by mouth 3 (three) times daily as needed for anxiety. 04/18/21   Shelda Pal, DO  pravastatin (PRAVACHOL) 20 MG tablet Take 1 tablet (20 mg total) by mouth daily. 04/18/21   Shelda Pal, DO  venlafaxine XR (EFFEXOR-XR) 75 MG 24 hr capsule TAKE 1 CAPSULE DAILY WITH BREAKFAST 02/24/22   Wendling, Crosby Oyster, DO    Family History Family History  Problem Relation Age of Onset   Cancer Mother    Heart disease Mother     Social History Social History   Tobacco Use   Smoking status: Never   Smokeless tobacco: Never  Substance Use Topics   Alcohol use: Never    Comment: occasional   Drug use: Never     Allergies   Amoxicillin and Codeine   Review of Systems Review of Systems  HENT:  Positive for congestion and sore throat.   Respiratory:  Positive for cough.   All other systems reviewed and are negative.    Physical Exam Triage Vital Signs ED Triage Vitals  Enc Vitals Group     BP 04/14/22 1057 138/85     Pulse Rate 04/14/22 1057 90     Resp 04/14/22 1057 18     Temp 04/14/22 1057 98.7 F (37.1 C)     Temp Source 04/14/22 1057 Oral     SpO2 04/14/22 1057 95 %     Weight --      Height --      Head Circumference --      Peak Flow --      Pain Score 04/14/22 1059 5     Pain Loc --      Pain Edu? --      Excl. in [place]? --    No data found.  Updated Vital Signs BP 138/85 (BP Location: Right Arm)   Pulse 90   Temp 98.7 F (37.1 C) (Oral)   Resp 18   SpO2 95%      Physical Exam Vitals and nursing note reviewed.  Constitutional:      General: She is not in acute distress.    Appearance: Normal appearance. She is obese. She is not ill-appearing.  HENT:     Head: Normocephalic and atraumatic.     Right Ear: Tympanic membrane, ear canal and external ear normal.     Left Ear: Tympanic membrane, ear canal and  external ear normal.     Mouth/Throat:     Mouth: Mucous membranes are moist.     Pharynx: Oropharynx is clear.  Eyes:     Extraocular Movements: Extraocular movements intact.     Conjunctiva/sclera: Conjunctivae normal.     Pupils: Pupils are equal, round, and reactive to light.  Cardiovascular:     Rate and Rhythm: Normal rate and regular rhythm.     Pulses: Normal pulses.     Heart sounds: Normal heart sounds.  Pulmonary:     Effort: Pulmonary effort is normal.     Breath sounds: Normal breath sounds.  Musculoskeletal:        General: Normal range of motion.     Cervical back: Normal range of motion and neck supple. No tenderness.  Lymphadenopathy:     Cervical: No cervical adenopathy.  Skin:    General: Skin is warm and dry.  Neurological:     General: No focal deficit present.     Mental Status: She is alert and oriented to person, place, and time. Mental status is at baseline.  Psychiatric:     Comments: Depressed mood, sad affect, crying throughout examination today      UC Treatments / Results  Labs (all labs ordered are listed, but only abnormal results are displayed) Labs Reviewed - No data to display  EKG   Radiology No results found.  Procedures Procedures (including critical care time)  Medications Ordered in UC Medications - No data to display  Initial Impression / Assessment and Plan / UC Course  I have reviewed the triage vital signs and the nursing notes.  Pertinent labs & imaging results that were available during my care of the patient were reviewed by me and considered in my medical decision making (see chart for details).     MDM: 1.  Subacute maxillary sinusitis-Rx Zithromax; 2.  Allergic rhinitis-Rx'd Allegra; 3.  Cough-Rx Tessalon Perles. Advised patient to take medication as directed with food to completion.  Advised patient to take Allegra with Zithromax daily for the next 5 days.  Advised may use Allegra as needed afterwards for  concurrent postnasal drainage/drip.  Advised may use Tessalon Perles daily or as needed for cough.  Encourage patient to increase daily water intake while taking these medications.  Advised patient if symptoms worsen please follow-up with PCP or go to nearest ED for further evaluation.  Final Clinical Impressions(s) / UC Diagnoses   Final diagnoses:  Subacute maxillary sinusitis  Allergic rhinitis, unspecified seasonality, unspecified trigger  Cough, unspecified type     Discharge Instructions      Advised patient to take medication as directed with food to completion.  Advised patient to take Allegra with Zithromax daily for the next 5 days.  Advised may use Allegra as needed afterwards for concurrent postnasal drainage/drip.  Advised may use Tessalon Perles daily or as needed for cough.  Encourage patient to increase daily water intake while taking these medications.  Advised patient if symptoms worsen please follow-up with PCP or go to nearest ED for further evaluation.     ED Prescriptions     Medication Sig Dispense Auth. Provider   azithromycin (ZITHROMAX) 250 MG tablet Take 1 tablet (250 mg total) by mouth daily. Take first 2 tablets together, then 1 every day until finished. 6 tablet Eliezer Lofts, FNP   fexofenadine [facility] ALLERGY) 180 MG tablet Take 1 tablet (180 mg total) by mouth daily for 15 days. 15 tablet Eliezer Lofts, FNP   benzonatate (TESSALON) 200 MG capsule Take 1 capsule (200 mg total) by mouth 3 (three) times daily as needed for up to 7 days. 40 capsule Eliezer Lofts, FNP      PDMP not reviewed this encounter.   Eliezer Lofts, [place] 04/14/22 1209

## 2022-04-24 ENCOUNTER — Encounter: Payer: Self-pay | Admitting: Family Medicine

## 2022-04-24 ENCOUNTER — Ambulatory Visit (INDEPENDENT_AMBULATORY_CARE_PROVIDER_SITE_OTHER): Admitting: Family Medicine

## 2022-04-24 VITALS — BP 110/72 | HR 74 | Temp 98.0°F | Resp 18 | Ht 67.0 in | Wt 218.4 lb

## 2022-04-24 DIAGNOSIS — F418 Other specified anxiety disorders: Secondary | ICD-10-CM | POA: Diagnosis not present

## 2022-04-24 DIAGNOSIS — Z23 Encounter for immunization: Secondary | ICD-10-CM

## 2022-04-24 DIAGNOSIS — E785 Hyperlipidemia, unspecified: Secondary | ICD-10-CM

## 2022-04-24 LAB — COMPREHENSIVE METABOLIC PANEL
ALT: 40 U/L — ABNORMAL HIGH (ref 0–35)
AST: 26 U/L (ref 0–37)
Albumin: 4.2 g/dL (ref 3.5–5.2)
Alkaline Phosphatase: 108 U/L (ref 39–117)
BUN: 17 mg/dL (ref 6–23)
CO2: 27 mEq/L (ref 19–32)
Calcium: 9.4 mg/dL (ref 8.4–10.5)
Chloride: 104 mEq/L (ref 96–112)
Creatinine, Ser: 0.82 mg/dL (ref 0.40–1.20)
GFR: 79.82 mL/min (ref >60.00)
Glucose, Bld: 95 mg/dL (ref 70–99)
Potassium: 3.8 mEq/L (ref 3.5–5.1)
Sodium: 139 mEq/L (ref 135–145)
Total Bilirubin: 0.4 mg/dL (ref 0.2–1.2)
Total Protein: 7.3 g/dL (ref 6.0–8.3)

## 2022-04-24 LAB — LIPID PANEL
Cholesterol: 151 mg/dL (ref 0–200)
HDL: 51.1 mg/dL (ref >39.00)
LDL Cholesterol: 68 mg/dL (ref 0–99)
NonHDL: 100.06
Total CHOL/HDL Ratio: 3
Triglycerides: 158 mg/dL — ABNORMAL HIGH (ref 0.0–149.0)
VLDL: 31.6 mg/dL (ref 0.0–40.0)

## 2022-04-24 MED ORDER — PRAVASTATIN SODIUM 20 MG PO TABS: 20.0000 mg | ORAL_TABLET | Freq: Every day | ORAL | 3 refills | Status: DC

## 2022-04-24 NOTE — Addendum Note (Signed)
Addended by: Sharon Seller B on: 04/24/2022 09:06 AM   Modules accepted: Orders

## 2022-04-24 NOTE — Progress Notes (Signed)
Chief Complaint  Patient presents with   Follow-up    Follow up on meds    Subjective Jenny Roberson presents for f/u anxiety.  Pt is currently being treated with Effexor XR 75 m/d.  Reports doing well since treatment. No thoughts of harming self or others. No self-medication with alcohol, prescription drugs or illicit drugs. Pt is not following with a counselor/psychologist.  Hyperlipidemia Patient presents for dyslipidemia follow up. Currently being treated with pravastatin 20 mg/d and compliance with treatment thus far has been good. She denies myalgias. She is adhering to a healthy diet. Exercise: not much The patient is not known to have coexisting coronary artery disease.  Past Medical History:  Diagnosis Date   Depression    Hyperlipidemia    Allergies as of 04/24/2022       Reactions   Amoxicillin Other (See Comments)   Palms of hands and bottom of feet turned red/skin peeled   Codeine Nausea And Vomiting        Medication List        Accurate as of April 24, 2022  8:56 AM. If you have any questions, ask your nurse or doctor.          STOP taking these medications    azithromycin 250 MG tablet Commonly known as: ZITHROMAX Stopped by: Shelda Pal, DO       TAKE these medications    fexofenadine 180 MG tablet Commonly known as: Allegra Allergy Take 1 tablet (180 mg total) by mouth daily for 15 days.   hydrOXYzine 25 MG tablet Commonly known as: ATARAX Take 1-3 tablets (25-75 mg total) by mouth 3 (three) times daily as needed for anxiety.   pravastatin 20 MG tablet Commonly known as: PRAVACHOL Take 1 tablet (20 mg total) by mouth daily.   venlafaxine XR 75 MG 24 hr capsule Commonly known as: EFFEXOR-XR TAKE 1 CAPSULE DAILY WITH BREAKFAST        Exam BP 110/72   Pulse 74   Temp 98 F (36.7 C) (Oral)   Resp 18   Ht 5\' 7"  (1.702 m)   Wt 218 lb 6.4 oz (99.1 kg)   SpO2 99%   BMI 34.21 kg/m  General:  well developed,  well nourished, in no apparent distress Heart: RRR, no LE edema, no bruits Lungs:  CTAB. No respiratory distress Psych: well oriented with normal range of affect and age-appropriate judgement/insight, alert and oriented x4.  Assessment and Plan  Anxiety with depression  Hyperlipidemia, unspecified hyperlipidemia type - Plan: pravastatin (PRAVACHOL) 20 MG tablet, Comprehensive metabolic panel, Lipid panel  Chronic, stable. Cont Effexor XR 75 mg/d.  Chronic, stable. Cont pravastatin 20 mg/d. Ck labs today. Counseled on diet/exercise. Needs to exercise a bit more.  Flu shot today.  F/u in 6 mo for CPE or prn. The patient voiced understanding and agreement to the plan.  [place], DO 04/24/22 8:56 AM

## 2022-04-24 NOTE — Patient Instructions (Signed)
Give us 2-3 business days to get the results of your labs back.   Keep the diet clean and stay active.  Aim to do some physical exertion for 150 minutes per week. This is typically divided into 5 days per week, 30 minutes per day. The activity should be enough to get your heart rate up. Anything is better than nothing if you have time constraints.  Let us know if you need anything.  

## 2022-04-27 ENCOUNTER — Other Ambulatory Visit: Payer: Self-pay | Admitting: Family Medicine

## 2022-04-27 DIAGNOSIS — E785 Hyperlipidemia, unspecified: Secondary | ICD-10-CM

## 2022-05-13 ENCOUNTER — Other Ambulatory Visit: Payer: Self-pay | Admitting: Family Medicine

## 2022-05-13 ENCOUNTER — Encounter: Payer: Self-pay | Admitting: Family Medicine

## 2022-05-13 ENCOUNTER — Other Ambulatory Visit (INDEPENDENT_AMBULATORY_CARE_PROVIDER_SITE_OTHER)

## 2022-05-13 DIAGNOSIS — E785 Hyperlipidemia, unspecified: Secondary | ICD-10-CM

## 2022-05-13 DIAGNOSIS — R7401 Elevation of levels of liver transaminase levels: Secondary | ICD-10-CM

## 2022-05-13 LAB — LIPID PANEL
Cholesterol: 147 mg/dL (ref 0–200)
HDL: 50.2 mg/dL (ref >39.00)
LDL Cholesterol: 65 mg/dL (ref 0–99)
NonHDL: 96.96
Total CHOL/HDL Ratio: 3
Triglycerides: 162 mg/dL — ABNORMAL HIGH (ref 0.0–149.0)
VLDL: 32.4 mg/dL (ref 0.0–40.0)

## 2022-05-13 LAB — HEPATIC FUNCTION PANEL
ALT: 41 U/L — ABNORMAL HIGH (ref 0–35)
AST: 27 U/L (ref 0–37)
Albumin: 4.2 g/dL (ref 3.5–5.2)
Alkaline Phosphatase: 112 U/L (ref 39–117)
Bilirubin, Direct: 0.1 mg/dL (ref 0.0–0.3)
Total Bilirubin: 0.5 mg/dL (ref 0.2–1.2)
Total Protein: 7 g/dL (ref 6.0–8.3)

## 2022-05-15 ENCOUNTER — Other Ambulatory Visit: Payer: Self-pay | Admitting: Family Medicine

## 2022-05-15 ENCOUNTER — Ambulatory Visit (HOSPITAL_BASED_OUTPATIENT_CLINIC_OR_DEPARTMENT_OTHER)
Admission: RE | Admit: 2022-05-15 | Discharge: 2022-05-15 | Disposition: A | Discharge status: Home / Self Care | Source: Ambulatory Visit | Attending: Family Medicine | Admitting: Family Medicine

## 2022-05-15 DIAGNOSIS — E785 Hyperlipidemia, unspecified: Secondary | ICD-10-CM

## 2022-05-15 DIAGNOSIS — R160 Hepatomegaly, not elsewhere classified: Secondary | ICD-10-CM

## 2022-05-15 DIAGNOSIS — K769 Liver disease, unspecified: Secondary | ICD-10-CM

## 2022-05-15 DIAGNOSIS — R7401 Elevation of levels of liver transaminase levels: Secondary | ICD-10-CM | POA: Diagnosis present

## 2022-05-25 ENCOUNTER — Other Ambulatory Visit: Payer: Self-pay | Admitting: Family Medicine

## 2022-05-25 MED ORDER — DIAZEPAM 5 MG PO TABS: ORAL_TABLET | ORAL | 0 refills | Status: DC

## 2022-06-13 ENCOUNTER — Ambulatory Visit (HOSPITAL_BASED_OUTPATIENT_CLINIC_OR_DEPARTMENT_OTHER)
Admission: RE | Admit: 2022-06-13 | Discharge: 2022-06-13 | Disposition: A | Discharge status: Home / Self Care | Source: Ambulatory Visit | Attending: Family Medicine | Admitting: Family Medicine

## 2022-06-13 DIAGNOSIS — K769 Liver disease, unspecified: Secondary | ICD-10-CM | POA: Insufficient documentation

## 2022-06-13 MED ORDER — GADOXETATE DISODIUM 0.25 MMOL/ML IV SOLN
15.0000 mL | Freq: Once | INTRAVENOUS | Status: AC | PRN
  Administered 2022-06-13: 15 mL via INTRAVENOUS

## 2022-06-24 ENCOUNTER — Other Ambulatory Visit (INDEPENDENT_AMBULATORY_CARE_PROVIDER_SITE_OTHER)

## 2022-06-24 DIAGNOSIS — R7401 Elevation of levels of liver transaminase levels: Secondary | ICD-10-CM

## 2022-06-24 DIAGNOSIS — E785 Hyperlipidemia, unspecified: Secondary | ICD-10-CM

## 2022-06-24 LAB — LIPID PANEL
Cholesterol: 152 mg/dL (ref 0–200)
HDL: 53.2 mg/dL (ref >39.00)
LDL Cholesterol: 77 mg/dL (ref 0–99)
NonHDL: 99.23
Total CHOL/HDL Ratio: 3
Triglycerides: 113 mg/dL (ref 0.0–149.0)
VLDL: 22.6 mg/dL (ref 0.0–40.0)

## 2022-06-24 LAB — HEPATIC FUNCTION PANEL
ALT: 31 U/L (ref 0–35)
AST: 20 U/L (ref 0–37)
Albumin: 4.1 g/dL (ref 3.5–5.2)
Alkaline Phosphatase: 102 U/L (ref 39–117)
Bilirubin, Direct: 0.1 mg/dL (ref 0.0–0.3)
Total Bilirubin: 0.3 mg/dL (ref 0.2–1.2)
Total Protein: 6.9 g/dL (ref 6.0–8.3)

## 2022-06-25 ENCOUNTER — Encounter: Payer: Self-pay | Admitting: Family Medicine

## 2022-06-26 ENCOUNTER — Other Ambulatory Visit: Payer: Self-pay | Admitting: Family Medicine

## 2022-06-26 MED ORDER — PRAVASTATIN SODIUM 40 MG PO TABS: 40.0000 mg | ORAL_TABLET | Freq: Every day | ORAL | 3 refills | Status: DC

## 2022-10-28 ENCOUNTER — Encounter: Payer: Self-pay | Admitting: Family Medicine

## 2022-10-28 ENCOUNTER — Ambulatory Visit (INDEPENDENT_AMBULATORY_CARE_PROVIDER_SITE_OTHER): Admitting: Family Medicine

## 2022-10-28 VITALS — BP 110/66 | HR 82 | Temp 97.8°F | Ht 67.0 in | Wt 217.4 lb

## 2022-10-28 DIAGNOSIS — Z0000 Encounter for general adult medical examination without abnormal findings: Secondary | ICD-10-CM

## 2022-10-28 DIAGNOSIS — Z23 Encounter for immunization: Secondary | ICD-10-CM

## 2022-10-28 LAB — LIPID PANEL
Cholesterol: 145 mg/dL (ref 0–200)
HDL: 59.2 mg/dL (ref >39.00)
LDL Cholesterol: 67 mg/dL (ref 0–99)
NonHDL: 85.59
Total CHOL/HDL Ratio: 2
Triglycerides: 94 mg/dL (ref 0.0–149.0)
VLDL: 18.8 mg/dL (ref 0.0–40.0)

## 2022-10-28 LAB — COMPREHENSIVE METABOLIC PANEL
ALT: 20 U/L (ref 0–35)
AST: 18 U/L (ref 0–37)
Albumin: 4.3 g/dL (ref 3.5–5.2)
Alkaline Phosphatase: 96 U/L (ref 39–117)
BUN: 18 mg/dL (ref 6–23)
CO2: 27 mEq/L (ref 19–32)
Calcium: 9.3 mg/dL (ref 8.4–10.5)
Chloride: 105 mEq/L (ref 96–112)
Creatinine, Ser: 0.78 mg/dL (ref 0.40–1.20)
GFR: 84.45 mL/min (ref >60.00)
Glucose, Bld: 101 mg/dL — ABNORMAL HIGH (ref 70–99)
Potassium: 4 mEq/L (ref 3.5–5.1)
Sodium: 139 mEq/L (ref 135–145)
Total Bilirubin: 0.4 mg/dL (ref 0.2–1.2)
Total Protein: 7.1 g/dL (ref 6.0–8.3)

## 2022-10-28 LAB — CBC
HCT: 35.2 % — ABNORMAL LOW (ref 36.0–46.0)
Hemoglobin: 11.9 g/dL — ABNORMAL LOW (ref 12.0–15.0)
MCHC: 33.8 g/dL (ref 30.0–36.0)
MCV: 91.9 fl (ref 78.0–100.0)
Platelets: 211 10*3/uL (ref 150.0–400.0)
RBC: 3.83 Mil/uL — ABNORMAL LOW (ref 3.87–5.11)
RDW: 14.9 % (ref 11.5–15.5)
WBC: 4.7 10*3/uL (ref 4.0–10.5)

## 2022-10-28 NOTE — Patient Instructions (Signed)
Give us 2-3 business days to get the results of your labs back.   Keep the diet clean and stay active.  Please get me a copy of your advanced directive form at your convenience.   Let us know if you need anything.  

## 2022-10-28 NOTE — Progress Notes (Signed)
Chief Complaint  Patient presents with   Annual Exam     Well Woman Jenny Roberson is here for a complete physical.   Her last physical was >1 year ago.  Current diet: in general, a "healthy" diet. Current exercise: not as much. Weight is stable and she denies fatigue out of ordinary. Seatbelt? Yes Advanced directive? No; has form at home.   Health Maintenance Pap/HPV- No longer has a cervix.  Mammogram- Yes Colon cancer screening-Yes Shingrix- Yes Tetanus- No Hep C screening- Yes HIV screening- Yes  Past Medical History:  Diagnosis Date   Depression    Hyperlipidemia      Past Surgical History:  Procedure Laterality Date   ABDOMINAL HYSTERECTOMY  2013   OTHER SURGICAL HISTORY  2014   lipoma removal    Medications  Current Outpatient Medications on File Prior to Visit  Medication Sig Dispense Refill   hydrOXYzine (ATARAX/VISTARIL) 25 MG tablet Take 1-3 tablets (25-75 mg total) by mouth 3 (three) times daily as needed for anxiety. 30 tablet 5   pravastatin (PRAVACHOL) 40 MG tablet Take 1 tablet (40 mg total) by mouth daily. 90 tablet 3   venlafaxine XR (EFFEXOR-XR) 75 MG 24 hr capsule TAKE 1 CAPSULE DAILY WITH BREAKFAST 90 capsule 3   Allergies Allergies  Allergen Reactions   Amoxicillin Other (See Comments)    Palms of hands and bottom of feet turned red/skin peeled   Codeine Nausea And Vomiting    Review of Systems: Constitutional:  no unexpected weight changes Eye:  no recent significant change in vision Ear/Nose/Mouth/Throat:  Ears:  no recent change in hearing Nose/Mouth/Throat:  no complaints of nasal congestion, no sore throat Cardiovascular: no chest pain Respiratory:  no shortness of breath Gastrointestinal:  no abdominal pain, no change in bowel habits GU:  Female: negative for dysuria or pelvic pain Musculoskeletal/Extremities:  no pain of the joints Integumentary (Skin/Breast):  no abnormal skin lesions reported Neurologic:  no  headaches Endocrine:  denies fatigue  Exam BP 110/66 (BP Location: Left Arm, Patient Position: Sitting, Cuff Size: Normal)   Pulse 82   Temp 97.8 F (36.6 C) (Oral)   Ht 5\' 7"  (1.702 m)   Wt 217 lb 6 oz (98.6 kg)   SpO2 97%   BMI 34.05 kg/m  General:  well developed, well nourished, in no apparent distress Skin:  no significant moles, warts, or growths Head:  no masses, lesions, or tenderness Eyes:  pupils equal and round, sclera anicteric without injection Ears:  canals without lesions, TMs shiny without retraction, no obvious effusion, no erythema Nose:  nares patent, mucosa normal, and no drainage  Throat/Pharynx:  lips and gingiva without lesion; tongue and uvula midline; non-inflamed pharynx; no exudates or postnasal drainage Neck: neck supple without adenopathy, thyromegaly, or masses Lungs:  clear to auscultation, breath sounds equal bilaterally, no respiratory distress Cardio:  regular rate and rhythm, no LE edema Abdomen:  abdomen soft, nontender; bowel sounds normal; no masses or organomegaly Genital: Defer to GYN Musculoskeletal:  symmetrical muscle groups noted without atrophy or deformity Extremities:  no clubbing, cyanosis, or edema, no deformities, no skin discoloration Neuro:  gait normal; deep tendon reflexes normal and symmetric Psych: well oriented with normal range of affect and appropriate judgment/insight  Assessment and Plan  Well adult exam - Plan: CBC, Comprehensive metabolic panel, Lipid panel  Need for Tdap vaccination - Plan: Tdap vaccine greater than or equal to 55yo IM   Well 57 y.o. female. Counseled on diet and  exercise. Advanced directive form requested today.  Tdap today.  Other orders as above. Follow up in 6 mo. The patient voiced understanding and agreement to the plan.  [place], DO 10/28/22 8:58 AM

## 2022-11-20 ENCOUNTER — Encounter: Payer: Self-pay | Admitting: Family Medicine

## 2022-11-20 ENCOUNTER — Ambulatory Visit: Admitting: Family Medicine

## 2022-11-20 VITALS — BP 132/80 | HR 87 | Temp 98.0°F | Resp 16 | Ht 67.0 in | Wt 215.4 lb

## 2022-11-20 DIAGNOSIS — J209 Acute bronchitis, unspecified: Secondary | ICD-10-CM | POA: Diagnosis not present

## 2022-11-20 LAB — POCT RAPID STREP A (OFFICE): Rapid Strep A Screen: NEGATIVE

## 2022-11-20 LAB — POCT INFLUENZA A/B
Influenza A, POC: NEGATIVE
Influenza B, POC: NEGATIVE

## 2022-11-20 MED ORDER — PROMETHAZINE-DM 6.25-15 MG/5ML PO SYRP: 5.0000 mL | ORAL_SOLUTION | Freq: Four times a day (QID) | ORAL | 0 refills | Status: DC | PRN

## 2022-11-20 NOTE — Patient Instructions (Addendum)
Continue to push fluids, practice good hand hygiene, and cover your mouth if you cough.  If you start having fevers, shaking or shortness of breath, seek immediate care.  OK to take Tylenol 1000 mg (2 extra strength tabs) or 975 mg (3 regular strength tabs) every 6 hours as needed.  Send me a message on Sunday if no better.   Let us know if you need anything.

## 2022-11-20 NOTE — Progress Notes (Signed)
Chief Complaint  Patient presents with   Cough    Here for Cough and sore throat    Jenny Roberson here for URI complaints.  Duration: 1 week  Associated symptoms: itchy watery eyes, sore throat, and cough (dry) Denies: sinus congestion, sinus pain, rhinorrhea, ear pain, ear drainage, wheezing, shortness of breath, myalgia, and fevers Treatment to date: Mucinex, Theraflu Sick contacts: No  Past Medical History:  Diagnosis Date   Depression    Hyperlipidemia     Objective BP 132/80 (BP Location: Right Arm, Patient Position: Sitting, Cuff Size: Normal)   Pulse 87   Temp 98 F (36.7 C) (Oral)   Resp 16   Ht 5\' 7"  (1.702 m)   Wt 215 lb 6.4 oz (97.7 kg)   SpO2 97%   BMI 33.74 kg/m  General: Awake, alert, appears stated age HEENT: AT, Jenny Roberson, ears patent b/l and TM's neg, nares patent w/o discharge, pharynx pink and without exudates, MMM, no sinus ttp b/l Neck: No masses or asymmetry Heart: RRR Lungs: CTAB, no accessory muscle use Psych: Age appropriate judgment and insight, normal mood and affect  Acute bronchitis, unspecified organism - Plan: promethazine-dextromethorphan (PROMETHAZINE-DM) 6.25-15 MG/5ML syrup  Continue to push fluids, practice good hand hygiene, cover mouth when coughing. Warnings about drowsiness w syrup. Send message in a few d if no better.  F/u prn. If starting to experience fevers, shaking, or shortness of breath, seek immediate care. Pt voiced understanding and agreement to the plan.  Jilda Roche [place], DO 11/20/22 2:44 PM

## 2022-12-02 LAB — HM MAMMOGRAPHY

## 2022-12-03 ENCOUNTER — Encounter: Payer: Self-pay | Admitting: Family Medicine

## 2022-12-03 ENCOUNTER — Other Ambulatory Visit: Payer: Self-pay | Admitting: Family Medicine

## 2023-05-04 ENCOUNTER — Ambulatory Visit: Admitting: Family Medicine

## 2023-05-04 ENCOUNTER — Encounter: Payer: Self-pay | Admitting: Family Medicine

## 2023-05-04 VITALS — BP 118/71 | HR 74 | Temp 98.1°F | Ht 67.0 in | Wt 217.1 lb

## 2023-05-04 DIAGNOSIS — D649 Anemia, unspecified: Secondary | ICD-10-CM

## 2023-05-04 DIAGNOSIS — F418 Other specified anxiety disorders: Secondary | ICD-10-CM | POA: Diagnosis not present

## 2023-05-04 DIAGNOSIS — E785 Hyperlipidemia, unspecified: Secondary | ICD-10-CM

## 2023-05-04 DIAGNOSIS — Z23 Encounter for immunization: Secondary | ICD-10-CM | POA: Diagnosis not present

## 2023-05-04 LAB — LIPID PANEL
Cholesterol: 135 mg/dL (ref 0–200)
HDL: 55.4 mg/dL (ref >39.00)
LDL Cholesterol: 57 mg/dL (ref 0–99)
NonHDL: 80.04
Total CHOL/HDL Ratio: 2
Triglycerides: 113 mg/dL (ref 0.0–149.0)
VLDL: 22.6 mg/dL (ref 0.0–40.0)

## 2023-05-04 LAB — COMPREHENSIVE METABOLIC PANEL
ALT: 13 U/L (ref 0–35)
AST: 12 U/L (ref 0–37)
Albumin: 4 g/dL (ref 3.5–5.2)
Alkaline Phosphatase: 102 U/L (ref 39–117)
BUN: 18 mg/dL (ref 6–23)
CO2: 24 meq/L (ref 19–32)
Calcium: 9.2 mg/dL (ref 8.4–10.5)
Chloride: 106 meq/L (ref 96–112)
Creatinine, Ser: 0.72 mg/dL (ref 0.40–1.20)
GFR: 92.63 mL/min (ref >60.00)
Glucose, Bld: 105 mg/dL — ABNORMAL HIGH (ref 70–99)
Potassium: 3.7 meq/L (ref 3.5–5.1)
Sodium: 138 meq/L (ref 135–145)
Total Bilirubin: 0.4 mg/dL (ref 0.2–1.2)
Total Protein: 6.9 g/dL (ref 6.0–8.3)

## 2023-05-04 MED ORDER — VENLAFAXINE HCL ER 75 MG PO CP24: 75.0000 mg | ORAL_CAPSULE | Freq: Every day | ORAL | 3 refills | Status: DC

## 2023-05-04 MED ORDER — PRAVASTATIN SODIUM 40 MG PO TABS: 40.0000 mg | ORAL_TABLET | Freq: Every day | ORAL | 3 refills | Status: DC

## 2023-05-04 MED ORDER — HYDROXYZINE HCL 25 MG PO TABS: 25.0000 mg | ORAL_TABLET | Freq: Three times a day (TID) | ORAL | 5 refills | Status: DC | PRN

## 2023-05-04 NOTE — Addendum Note (Signed)
Addended by: Mervin Kung A on: 05/04/2023 09:03 AM   Modules accepted: Orders

## 2023-05-04 NOTE — Progress Notes (Signed)
Chief Complaint  Patient presents with   Follow-up    6 month     Subjective Jenny Roberson presents for f/u anxiety/depression.  Pt is currently being treated with Effexor XR 75 mg/d.  Reports doing well since treatment. No thoughts of harming self or others. No self-medication with alcohol, prescription drugs or illicit drugs. Pt is not following with a counselor/psychologist.  Hyperlipidemia Patient presents for dyslipidemia follow up. Currently being treated with pravastatin 40 mg/d and compliance with treatment thus far has been good. Takes Atarax prn, rarely.  She denies myalgias. She is adhering to a healthy diet. Exercise: some walking No CP or SOB.  The patient is not known to have coexisting coronary artery disease.  Past Medical History:  Diagnosis Date   Depression    Hyperlipidemia    Allergies as of 05/04/2023       Reactions   Amoxicillin Other (See Comments)   Palms of hands and bottom of feet turned red/skin peeled   Codeine Nausea And Vomiting        Medication List        Accurate as of May 04, 2023  8:57 AM. If you have any questions, ask your nurse or doctor.          STOP taking these medications    promethazine-dextromethorphan 6.25-15 MG/5ML syrup Commonly known as: PROMETHAZINE-DM Stopped by: Sharlene Dory       TAKE these medications    hydrOXYzine 25 MG tablet Commonly known as: ATARAX Take 1-3 tablets (25-75 mg total) by mouth 3 (three) times daily as needed for anxiety.   pravastatin 40 MG tablet Commonly known as: PRAVACHOL Take 1 tablet (40 mg total) by mouth daily.   venlafaxine XR 75 MG 24 hr capsule Commonly known as: EFFEXOR-XR Take 1 capsule (75 mg total) by mouth daily with breakfast. What changed: See the new instructions. Changed by: Sharlene Dory        Exam BP 118/71 (BP Location: Left Arm, Patient Position: Sitting, Cuff Size: Large)   Pulse 74   Temp 98.1 F (36.7 C) (Oral)    Ht 5\' 7"  (1.702 m)   Wt 217 lb 2 oz (98.5 kg)   SpO2 97%   BMI 34.01 kg/m  General:  well developed, well nourished, in no apparent distress Heart: RRR, no bruits, no LE edema Lungs:  CTAB. No respiratory distress Psych: well oriented with normal range of affect and age-appropriate judgement/insight, alert and oriented x4.  Assessment and Plan  Hyperlipidemia, unspecified hyperlipidemia type - Plan: Lipid panel, Comprehensive metabolic panel  Anxiety with depression - Plan: venlafaxine XR (EFFEXOR-XR) 75 MG 24 hr capsule, hydrOXYzine (ATARAX) 25 MG tablet  Anemia, unspecified type - Plan: Pathologist smear review  Need for influenza vaccination - Plan: Flu vaccine trivalent PF, 6mos and older(Flulaval,Afluria,Fluarix,Fluzone)  Chronic, stable. Cont pravastatin 40 mg/d. Counseled on diet/exercises.  Chroinc, stable. Cont Effexor XR 75 mg/d, Atarax prn.  Ck smear.  Flu shot today.  F/u in 6 mo for CPE or prn. The patient voiced understanding and agreement to the plan.  Jilda Roche [place], DO 05/04/23 8:57 AM

## 2023-05-04 NOTE — Patient Instructions (Addendum)
Give [place] 2-3 business days to get the results of your labs back. One of your tests may take around 1 week.   Keep the diet clean and stay active.  Aim to do some physical exertion for 150 minutes per week. This is typically divided into 5 days per week, 30 minutes per day. The activity should be enough to get your heart rate up. Anything is better than nothing if you have time constraints.  Please consider adding some weight resistance exercise to your routine. Consider yoga as well.   Let us know if you need anything.

## 2023-05-04 NOTE — Addendum Note (Signed)
Addended by: Mervin Kung A on: 05/04/2023 09:05 AM   Modules accepted: Orders

## 2023-05-04 NOTE — Addendum Note (Signed)
Addended by: Radene Gunning on: 05/04/2023 09:02 AM   Modules accepted: Orders

## 2023-05-05 ENCOUNTER — Other Ambulatory Visit (INDEPENDENT_AMBULATORY_CARE_PROVIDER_SITE_OTHER)

## 2023-05-05 ENCOUNTER — Other Ambulatory Visit: Payer: Self-pay | Admitting: Family Medicine

## 2023-05-05 DIAGNOSIS — D649 Anemia, unspecified: Secondary | ICD-10-CM

## 2023-05-05 LAB — CBC
HCT: 32.1 % — ABNORMAL LOW (ref 35.0–45.0)
Hemoglobin: 10.2 g/dL — ABNORMAL LOW (ref 11.7–15.5)
MCH: 29.6 pg (ref 27.0–33.0)
MCHC: 31.8 g/dL — ABNORMAL LOW (ref 32.0–36.0)
MCV: 93 fL (ref 80.0–100.0)
MPV: 11 fL (ref 7.5–12.5)
Platelets: 162 10*3/uL (ref 140–400)
RBC: 3.45 10*6/uL — ABNORMAL LOW (ref 3.80–5.10)
RDW: 12.4 % (ref 11.0–15.0)
WBC: 4.7 10*3/uL (ref 3.8–10.8)

## 2023-05-05 LAB — PATHOLOGIST SMEAR REVIEW

## 2023-05-05 LAB — IBC + FERRITIN
Ferritin: 6 ng/mL — ABNORMAL LOW (ref 10.0–291.0)
Iron: 66 ug/dL (ref 42–145)
Saturation Ratios: 14.8 % — ABNORMAL LOW (ref 20.0–50.0)
TIBC: 445.2 ug/dL (ref 250.0–450.0)
Transferrin: 318 mg/dL (ref 212.0–360.0)

## 2023-05-07 ENCOUNTER — Other Ambulatory Visit: Payer: Self-pay | Admitting: Family Medicine

## 2023-05-07 DIAGNOSIS — D649 Anemia, unspecified: Secondary | ICD-10-CM

## 2023-05-07 NOTE — Addendum Note (Signed)
Addended by: Scharlene Gloss B on: 05/07/2023 01:53 PM   Modules accepted: Orders

## 2023-05-25 ENCOUNTER — Other Ambulatory Visit (HOSPITAL_BASED_OUTPATIENT_CLINIC_OR_DEPARTMENT_OTHER): Payer: Self-pay

## 2023-05-25 MED ORDER — COVID-19 MRNA VAC-TRIS(PFIZER) 30 MCG/0.3ML IM SUSY
0.3000 mL | PREFILLED_SYRINGE | Freq: Once | INTRAMUSCULAR | 0 refills | Status: AC
  Filled 2023-05-25: qty 0.3, 1d supply, fill #0

## 2023-06-17 ENCOUNTER — Other Ambulatory Visit

## 2023-06-18 ENCOUNTER — Other Ambulatory Visit (INDEPENDENT_AMBULATORY_CARE_PROVIDER_SITE_OTHER)

## 2023-06-18 DIAGNOSIS — D649 Anemia, unspecified: Secondary | ICD-10-CM | POA: Diagnosis not present

## 2023-06-18 LAB — IBC + FERRITIN
Ferritin: 9.2 ng/mL — ABNORMAL LOW (ref 10.0–291.0)
Iron: 63 ug/dL (ref 42–145)
Saturation Ratios: 15.3 % — ABNORMAL LOW (ref 20.0–50.0)
TIBC: 411.6 ug/dL (ref 250.0–450.0)
Transferrin: 294 mg/dL (ref 212.0–360.0)

## 2023-06-18 LAB — CBC
HCT: 36.2 % (ref 36.0–46.0)
Hemoglobin: 12 g/dL (ref 12.0–15.0)
MCHC: 33.1 g/dL (ref 30.0–36.0)
MCV: 93.5 fL (ref 78.0–100.0)
Platelets: 220 10*3/uL (ref 150.0–400.0)
RBC: 3.88 Mil/uL (ref 3.87–5.11)
RDW: 13.7 % (ref 11.5–15.5)
WBC: 4.4 10*3/uL (ref 4.0–10.5)

## 2023-06-21 ENCOUNTER — Other Ambulatory Visit: Payer: Self-pay

## 2023-06-21 DIAGNOSIS — D649 Anemia, unspecified: Secondary | ICD-10-CM

## 2023-08-16 ENCOUNTER — Telehealth: Payer: Self-pay | Admitting: *Deleted

## 2023-08-16 ENCOUNTER — Other Ambulatory Visit (INDEPENDENT_AMBULATORY_CARE_PROVIDER_SITE_OTHER)

## 2023-08-16 ENCOUNTER — Encounter: Payer: Self-pay | Admitting: Family Medicine

## 2023-08-16 DIAGNOSIS — D649 Anemia, unspecified: Secondary | ICD-10-CM

## 2023-08-16 LAB — URINALYSIS, ROUTINE W REFLEX MICROSCOPIC
Bilirubin Urine: NEGATIVE
Ketones, ur: NEGATIVE
Nitrite: NEGATIVE
Specific Gravity, Urine: 1.025 (ref 1.000–1.030)
Total Protein, Urine: NEGATIVE
Urine Glucose: NEGATIVE
Urobilinogen, UA: 0.2 (ref 0.0–1.0)
pH: 6 (ref 5.0–8.0)

## 2023-08-16 LAB — CBC WITH DIFFERENTIAL/PLATELET
Basophils Absolute: 0 10*3/uL (ref 0.0–0.1)
Basophils Relative: 0.3 % (ref 0.0–3.0)
Eosinophils Absolute: 0.1 10*3/uL (ref 0.0–0.7)
Eosinophils Relative: 2.9 % (ref 0.0–5.0)
HCT: 38 % (ref 36.0–46.0)
Hemoglobin: 12.9 g/dL (ref 12.0–15.0)
Lymphocytes Relative: 31.6 % (ref 12.0–46.0)
Lymphs Abs: 1.5 10*3/uL (ref 0.7–4.0)
MCHC: 33.9 g/dL (ref 30.0–36.0)
MCV: 92.2 fL (ref 78.0–100.0)
Monocytes Absolute: 0.3 10*3/uL (ref 0.1–1.0)
Monocytes Relative: 7.2 % (ref 3.0–12.0)
Neutro Abs: 2.7 10*3/uL (ref 1.4–7.7)
Neutrophils Relative %: 58 % (ref 43.0–77.0)
Platelets: 194 10*3/uL (ref 150.0–400.0)
RBC: 4.12 Mil/uL (ref 3.87–5.11)
RDW: 13.3 % (ref 11.5–15.5)
WBC: 4.7 10*3/uL (ref 4.0–10.5)

## 2023-08-16 NOTE — Telephone Encounter (Signed)
Patient declined to IFOB at lab visit today.  She was initially advised about it but I did try to at least explained why it needed to be done but she still declined.

## 2023-08-17 LAB — IRON,TIBC AND FERRITIN PANEL
%SAT: 26 % (ref 16–45)
Ferritin: 19 ng/mL (ref 16–232)
Iron: 94 ug/dL (ref 45–160)
TIBC: 356 ug/dL (ref 250–450)

## 2023-08-17 NOTE — Telephone Encounter (Signed)
Called pt and sent her mychart message, ask her when the last time she was seen by GI doctor. Ask her to call our office let us know.

## 2023-08-17 NOTE — Telephone Encounter (Signed)
Called pt lvm needed to spoke with her about last time she saw  Gi doctor. Ask her to give the office a call back.

## 2023-10-18 ENCOUNTER — Other Ambulatory Visit: Payer: Self-pay | Admitting: Medical Genetics

## 2023-11-03 ENCOUNTER — Encounter: Payer: Self-pay | Admitting: Family Medicine

## 2023-11-03 ENCOUNTER — Ambulatory Visit (INDEPENDENT_AMBULATORY_CARE_PROVIDER_SITE_OTHER): Admitting: Family Medicine

## 2023-11-03 VITALS — BP 122/70 | HR 65 | Temp 97.7°F | Ht 67.0 in | Wt 211.0 lb

## 2023-11-03 DIAGNOSIS — Z0000 Encounter for general adult medical examination without abnormal findings: Secondary | ICD-10-CM

## 2023-11-03 LAB — CBC
HCT: 38 % (ref 36.0–46.0)
Hemoglobin: 12.8 g/dL (ref 12.0–15.0)
MCHC: 33.6 g/dL (ref 30.0–36.0)
MCV: 95.6 fl (ref 78.0–100.0)
Platelets: 222 10*3/uL (ref 150.0–400.0)
RBC: 3.98 Mil/uL (ref 3.87–5.11)
RDW: 12.9 % (ref 11.5–15.5)
WBC: 4.7 10*3/uL (ref 4.0–10.5)

## 2023-11-03 LAB — LIPID PANEL
Cholesterol: 143 mg/dL (ref 0–200)
HDL: 53.8 mg/dL (ref >39.00)
LDL Cholesterol: 70 mg/dL (ref 0–99)
NonHDL: 88.79
Total CHOL/HDL Ratio: 3
Triglycerides: 96 mg/dL (ref 0.0–149.0)
VLDL: 19.2 mg/dL (ref 0.0–40.0)

## 2023-11-03 LAB — COMPREHENSIVE METABOLIC PANEL WITH GFR
ALT: 14 U/L (ref 0–35)
AST: 14 U/L (ref 0–37)
Albumin: 4.5 g/dL (ref 3.5–5.2)
Alkaline Phosphatase: 103 U/L (ref 39–117)
BUN: 20 mg/dL (ref 6–23)
CO2: 26 meq/L (ref 19–32)
Calcium: 9.3 mg/dL (ref 8.4–10.5)
Chloride: 107 meq/L (ref 96–112)
Creatinine, Ser: 0.81 mg/dL (ref 0.40–1.20)
GFR: 80.14 mL/min (ref >60.00)
Glucose, Bld: 102 mg/dL — ABNORMAL HIGH (ref 70–99)
Potassium: 4.1 meq/L (ref 3.5–5.1)
Sodium: 140 meq/L (ref 135–145)
Total Bilirubin: 0.5 mg/dL (ref 0.2–1.2)
Total Protein: 7.2 g/dL (ref 6.0–8.3)

## 2023-11-03 NOTE — Progress Notes (Signed)
 Chief Complaint  Patient presents with   Annual Exam    Patient presents today for a physical exam.     Well Woman Jenny Roberson is here for a complete physical.   Her last physical was >1 year ago.  Current diet: in general, a "pretty good" diet. Current exercise: walking.  Weight is stable and she denies fatigue out of ordinary. Patient has had a hysterectomy. Seatbelt? Yes Advanced directive? No  Health Maintenance Pap/HPV- N/A- hysterectomy Mammogram- Yes Colon cancer screening-Yes Shingrix- Yes Tetanus- Yes Hep C screening- Yes HIV screening- Yes  Past Medical History:  Diagnosis Date   Depression    Hyperlipidemia      Past Surgical History:  Procedure Laterality Date   ABDOMINAL HYSTERECTOMY  2013   OTHER SURGICAL HISTORY  2014   lipoma removal    Medications  Current Outpatient Medications on File Prior to Visit  Medication Sig Dispense Refill   hydrOXYzine (ATARAX) 25 MG tablet Take 1-3 tablets (25-75 mg total) by mouth 3 (three) times daily as needed for anxiety. 30 tablet 5   pravastatin (PRAVACHOL) 40 MG tablet Take 1 tablet (40 mg total) by mouth daily. 90 tablet 3   venlafaxine XR (EFFEXOR-XR) 75 MG 24 hr capsule Take 1 capsule (75 mg total) by mouth daily with breakfast. 90 capsule 3    Allergies Allergies  Allergen Reactions   Amoxicillin Other (See Comments)    Palms of hands and bottom of feet turned red/skin peeled   Codeine Nausea And Vomiting    Review of Systems: Constitutional:  no unexpected weight changes Eye:  no recent significant change in vision Ear/Nose/Mouth/Throat:  Ears:  no recent change in hearing Nose/Mouth/Throat:  no complaints of nasal congestion, no sore throat Cardiovascular: no chest pain Respiratory:  no shortness of breath Gastrointestinal:  no abdominal pain, no change in bowel habits GU:  Female: negative for dysuria or pelvic pain Musculoskeletal/Extremities:  no pain of the joints Integumentary  (Skin/Breast):  no abnormal skin lesions reported Neurologic:  no headaches Endocrine:  denies fatigue  Exam BP 122/70   Pulse 65   Temp 97.7 F (36.5 C)   Ht 5\' 7"  (1.702 m)   Wt 211 lb (95.7 kg)   SpO2 99%   BMI 33.05 kg/m  General:  well developed, well nourished, in no apparent distress Skin:  no significant moles, warts, or growths Head:  no masses, lesions, or tenderness Eyes:  pupils equal and round, sclera anicteric without injection Ears:  canals without lesions, TMs shiny without retraction, no obvious effusion, no erythema Nose:  nares patent, mucosa normal, and no drainage  Throat/Pharynx:  lips and gingiva without lesion; tongue and uvula midline; non-inflamed pharynx; no exudates or postnasal drainage Neck: neck supple without adenopathy, thyromegaly, or masses Lungs:  clear to auscultation, breath sounds equal bilaterally, no respiratory distress Cardio:  regular rate and rhythm, no LE edema Abdomen:  abdomen soft, nontender; bowel sounds normal; no masses or organomegaly Genital: Defer to GYN Musculoskeletal:  symmetrical muscle groups noted without atrophy or deformity Extremities:  no clubbing, cyanosis, or edema, no deformities, no skin discoloration Neuro:  gait normal; deep tendon reflexes normal and symmetric Psych: well oriented with normal range of affect and appropriate judgment/insight  Assessment and Plan  Well adult exam - Plan: CBC, Comprehensive metabolic panel with GFR, Lipid panel   Well 58 y.o. female. Counseled on diet and exercise. Advanced directive form provided today.  Other orders as above. Follow up in 6  mo. The patient voiced understanding and agreement to the plan.  Jilda Roche [place], DO 11/03/23 8:34 AM

## 2023-11-03 NOTE — Patient Instructions (Signed)
Give us 2-3 business days to get the results of your labs back.   Keep the diet clean and stay active.  Please consider adding some weight resistance exercise to your routine. Consider yoga as well.   Please get me a copy of your advanced directive form at your convenience.   Let us know if you need anything.  

## 2023-12-03 ENCOUNTER — Encounter: Payer: Self-pay | Admitting: Family Medicine

## 2023-12-03 ENCOUNTER — Ambulatory Visit: Admitting: Family Medicine

## 2023-12-03 VITALS — BP 112/72 | HR 95 | Temp 98.3°F | Resp 16 | Ht 67.0 in | Wt 210.0 lb

## 2023-12-03 DIAGNOSIS — J069 Acute upper respiratory infection, unspecified: Secondary | ICD-10-CM | POA: Diagnosis not present

## 2023-12-03 MED ORDER — BENZONATATE 200 MG PO CAPS: 200.0000 mg | ORAL_CAPSULE | Freq: Two times a day (BID) | ORAL | 0 refills | Status: DC | PRN

## 2023-12-03 NOTE — Patient Instructions (Addendum)
 Continue to push fluids, practice good hand hygiene, and cover your mouth if you cough. ? ?If you start having fevers, shaking or shortness of breath, seek immediate care. ? ?OK to take Tylenol 1000 mg (2 extra strength tabs) or 975 mg (3 regular strength tabs) every 6 hours as needed. ? ?Let us know if you need anything. ?

## 2023-12-03 NOTE — Progress Notes (Signed)
 Chief Complaint  Patient presents with   Cough    Sx: sunday   Sore Throat    monday   Nasal Congestion    Sx: monday     Jenny Roberson here for URI complaints.  Duration: 5 days  Associated symptoms: Fever (100.1 F), sinus pain, rhinorrhea, sore throat, wheezing, and dry cough Denies: sinus congestion, itchy watery eyes, ear pain, ear drainage, shortness of breath, myalgia, and fevers Treatment to date: Mucinex, Theraflu Sick contacts: Yes- mom  Past Medical History:  Diagnosis Date   Depression    Hyperlipidemia     Objective BP 112/72   Pulse 95   Temp 98.3 F (36.8 C) (Oral)   Resp 16   Ht 5\' 7"  (1.702 m)   Wt 210 lb (95.3 kg)   SpO2 95%   BMI 32.89 kg/m  General: Awake, alert, appears stated age HEENT: AT, [place], ears patent b/l and TM's neg, nares patent w/o discharge, pharynx pink and without exudates, MMM, no sinus ttp b/l Neck: No masses or asymmetry Heart: RRR Lungs: CTAB, no accessory muscle use Psych: Age appropriate judgment and insight, normal mood and affect  Viral URI with cough - Plan: benzonatate  (TESSALON ) 200 MG capsule  Tessalon  [place] prn. Send message if no better. Continue to push fluids, practice good hand hygiene, cover mouth when coughing. F/u prn. If starting to experience fevers, shaking, or shortness of breath, seek immediate care. Pt voiced understanding and agreement to the plan.  Shellie Dials [place], DO 12/03/23 3:00 PM

## 2023-12-07 ENCOUNTER — Ambulatory Visit: Admitting: Family Medicine

## 2023-12-10 LAB — HM MAMMOGRAPHY

## 2023-12-23 ENCOUNTER — Encounter: Payer: Self-pay | Admitting: Family Medicine

## 2024-01-18 ENCOUNTER — Other Ambulatory Visit: Payer: Self-pay | Admitting: Medical Genetics

## 2024-01-18 DIAGNOSIS — Z006 Encounter for examination for normal comparison and control in clinical research program: Secondary | ICD-10-CM

## 2024-01-28 LAB — GENECONNECT MOLECULAR SCREEN: Genetic Analysis Overall Interpretation: NEGATIVE

## 2024-05-03 ENCOUNTER — Ambulatory Visit: Payer: Self-pay | Admitting: Family Medicine

## 2024-05-03 ENCOUNTER — Ambulatory Visit: Admitting: Family Medicine

## 2024-05-03 ENCOUNTER — Encounter: Payer: Self-pay | Admitting: Family Medicine

## 2024-05-03 VITALS — BP 120/72 | HR 82 | Temp 98.0°F | Resp 16 | Ht 67.0 in | Wt 201.6 lb

## 2024-05-03 DIAGNOSIS — F411 Generalized anxiety disorder: Secondary | ICD-10-CM | POA: Diagnosis not present

## 2024-05-03 DIAGNOSIS — E785 Hyperlipidemia, unspecified: Secondary | ICD-10-CM | POA: Diagnosis not present

## 2024-05-03 DIAGNOSIS — F339 Major depressive disorder, recurrent, unspecified: Secondary | ICD-10-CM | POA: Diagnosis not present

## 2024-05-03 DIAGNOSIS — Z23 Encounter for immunization: Secondary | ICD-10-CM

## 2024-05-03 LAB — COMPREHENSIVE METABOLIC PANEL WITH GFR
ALT: 11 U/L (ref 0–35)
AST: 13 U/L (ref 0–37)
Albumin: 4.2 g/dL (ref 3.5–5.2)
Alkaline Phosphatase: 95 U/L (ref 39–117)
BUN: 17 mg/dL (ref 6–23)
CO2: 26 meq/L (ref 19–32)
Calcium: 9 mg/dL (ref 8.4–10.5)
Chloride: 107 meq/L (ref 96–112)
Creatinine, Ser: 0.75 mg/dL (ref 0.40–1.20)
GFR: 87.58 mL/min (ref >60.00)
Glucose, Bld: 100 mg/dL — ABNORMAL HIGH (ref 70–99)
Potassium: 3.8 meq/L (ref 3.5–5.1)
Sodium: 141 meq/L (ref 135–145)
Total Bilirubin: 0.4 mg/dL (ref 0.2–1.2)
Total Protein: 7 g/dL (ref 6.0–8.3)

## 2024-05-03 LAB — LIPID PANEL
Cholesterol: 147 mg/dL (ref 0–200)
HDL: 55.1 mg/dL (ref >39.00)
LDL Cholesterol: 69 mg/dL (ref 0–99)
NonHDL: 91.52
Total CHOL/HDL Ratio: 3
Triglycerides: 112 mg/dL (ref 0.0–149.0)
VLDL: 22.4 mg/dL (ref 0.0–40.0)

## 2024-05-03 MED ORDER — HYDROXYZINE HCL 25 MG PO TABS: 25.0000 mg | ORAL_TABLET | Freq: Three times a day (TID) | ORAL | 5 refills | Status: AC | PRN

## 2024-05-03 NOTE — Addendum Note (Signed)
 Addended by: ELOUISE POWELL HERO on: 05/03/2024 10:44 AM   Modules accepted: Orders

## 2024-05-03 NOTE — Patient Instructions (Signed)
 Give Jenny Roberson 2-3 business days to get the results of your labs back.   Keep the diet clean and stay active.  Please consider adding some weight resistance exercise to your routine. Consider yoga as well.   Let Jenny Roberson know if you need anything.

## 2024-05-03 NOTE — Progress Notes (Signed)
 Chief Complaint  Patient presents with   Follow-up    Follow Up    Subjective: Hyperlipidemia Patient presents for Hyperlipidemia follow up. Currently taking Pravachol  40 mg/d and compliance with treatment thus far has been good. She denies myalgias. She is usually adhering to a healthy diet. Exercise: walking No Cp or SOB.  The patient is not known to have coexisting coronary artery disease.  GAD Pt is currently being treated with Effexor  XR 75 mg/d, Atarax  25 mg daily as needed.  She uses the latter around once or twice per month.  Works well. No adverse effects of medication. Reports doing well since treatment. No thoughts of harming self or others. No self-medication with alcohol, prescription drugs or illicit drugs. Pt is not following with a counselor/psychologist.  Past Medical History:  Diagnosis Date   Depression    Hyperlipidemia     Objective: BP 120/72 (BP Location: Left Arm, Patient Position: Sitting)   Pulse 82   Temp 98 F (36.7 C) (Oral)   Resp 16   Ht 5' 7 (1.702 m)   Wt 201 lb 9.6 oz (91.4 kg)   SpO2 95%   BMI 31.58 kg/m  General: Awake, appears stated age HEENT: MMM Heart: RRR, no LE edema, no bruits Lungs: CTAB, no rales, wheezes or rhonchi. No accessory muscle use Psych: Age appropriate judgment and insight, normal affect and mood  Assessment and Plan: Hyperlipidemia, unspecified hyperlipidemia type - Plan: Comprehensive metabolic panel with GFR, Lipid panel  Depression, recurrent  GAD (generalized anxiety disorder) - Plan: hydrOXYzine  (ATARAX ) 25 MG tablet  Chronic. Stable.  Continue pravastatin  40 mg daily.  Counseled on diet and exercise.  Consider adding strength training. 2/3.  Chronic, stable.  Continue Atarax  25 mg daily as needed, Effexor  XR 75 mg daily. F/u in 6 mo. The patient voiced understanding and agreement to the plan.  Mabel [place], DO 05/03/24  10:42 AM

## 2024-06-06 ENCOUNTER — Other Ambulatory Visit: Payer: Self-pay | Admitting: Family Medicine

## 2024-06-06 DIAGNOSIS — F418 Other specified anxiety disorders: Secondary | ICD-10-CM

## 2024-08-25 ENCOUNTER — Other Ambulatory Visit: Payer: Self-pay

## 2024-08-25 MED ORDER — PRAVASTATIN SODIUM 40 MG PO TABS: 40.0000 mg | ORAL_TABLET | Freq: Every day | ORAL | 3 refills | Status: DC

## 2024-08-25 MED ORDER — PRAVASTATIN SODIUM 40 MG PO TABS: 40.0000 mg | ORAL_TABLET | Freq: Every day | ORAL | 3 refills | Status: AC

## 2024-11-01 ENCOUNTER — Ambulatory Visit: Admitting: Family Medicine
